# Patient Record
Sex: Male | Born: 1975
Health system: Southern US, Community
[De-identification: ages and names within clinical notes are randomized; demographics above are authoritative.]

## PROBLEM LIST (undated history)

## (undated) DIAGNOSIS — M179 Osteoarthritis of knee, unspecified: Secondary | ICD-10-CM

## (undated) DIAGNOSIS — R7303 Prediabetes: Secondary | ICD-10-CM

## (undated) DIAGNOSIS — Z87442 Personal history of urinary calculi: Secondary | ICD-10-CM

## (undated) DIAGNOSIS — I10 Essential (primary) hypertension: Secondary | ICD-10-CM

## (undated) DIAGNOSIS — Z8739 Personal history of other diseases of the musculoskeletal system and connective tissue: Secondary | ICD-10-CM

## (undated) DIAGNOSIS — M1990 Unspecified osteoarthritis, unspecified site: Secondary | ICD-10-CM

## (undated) DIAGNOSIS — M1710 Unilateral primary osteoarthritis, unspecified knee: Secondary | ICD-10-CM

## (undated) HISTORY — DX: Unspecified osteoarthritis, unspecified site: M19.90

## (undated) HISTORY — DX: Essential (primary) hypertension: I10

## (undated) HISTORY — PX: ARTHROSCOPY KNEE W/ DRILLING: SUR92

---

## 2000-05-01 ENCOUNTER — Encounter: Payer: Self-pay | Admitting: Orthopedic Surgery

## 2000-05-01 ENCOUNTER — Ambulatory Visit (HOSPITAL_COMMUNITY): Admission: RE | Admit: 2000-05-01 | Discharge: 2000-05-01 | Payer: Self-pay | Admitting: Orthopedic Surgery

## 2001-07-05 ENCOUNTER — Emergency Department (HOSPITAL_COMMUNITY): Admission: EM | Admit: 2001-07-05 | Discharge: 2001-07-05 | Payer: Self-pay | Admitting: Emergency Medicine

## 2001-07-16 ENCOUNTER — Emergency Department (HOSPITAL_COMMUNITY): Admission: EM | Admit: 2001-07-16 | Discharge: 2001-07-16 | Payer: Self-pay | Admitting: Emergency Medicine

## 2005-02-16 ENCOUNTER — Emergency Department (HOSPITAL_COMMUNITY): Admission: EM | Admit: 2005-02-16 | Discharge: 2005-02-16 | Payer: Self-pay | Admitting: Emergency Medicine

## 2005-02-21 ENCOUNTER — Encounter: Admission: RE | Admit: 2005-02-21 | Discharge: 2005-02-21 | Payer: Self-pay | Admitting: Sports Medicine

## 2006-06-12 ENCOUNTER — Encounter: Admission: RE | Admit: 2006-06-12 | Discharge: 2006-06-12 | Payer: Self-pay | Admitting: Sports Medicine

## 2011-02-06 ENCOUNTER — Emergency Department (HOSPITAL_COMMUNITY): Payer: BC Managed Care – PPO

## 2011-02-06 ENCOUNTER — Emergency Department (HOSPITAL_COMMUNITY)
Admission: EM | Admit: 2011-02-06 | Discharge: 2011-02-06 | Disposition: A | Discharge status: Home / Self Care | Payer: BC Managed Care – PPO | Attending: Emergency Medicine | Admitting: Emergency Medicine

## 2011-02-06 DIAGNOSIS — R10816 Epigastric abdominal tenderness: Secondary | ICD-10-CM | POA: Insufficient documentation

## 2011-02-06 DIAGNOSIS — R0602 Shortness of breath: Secondary | ICD-10-CM | POA: Insufficient documentation

## 2011-02-06 DIAGNOSIS — I10 Essential (primary) hypertension: Secondary | ICD-10-CM | POA: Insufficient documentation

## 2011-02-06 DIAGNOSIS — R209 Unspecified disturbances of skin sensation: Secondary | ICD-10-CM | POA: Insufficient documentation

## 2011-02-06 DIAGNOSIS — R110 Nausea: Secondary | ICD-10-CM | POA: Insufficient documentation

## 2011-02-06 DIAGNOSIS — R1013 Epigastric pain: Secondary | ICD-10-CM | POA: Insufficient documentation

## 2011-02-06 LAB — DIFFERENTIAL
Basophils Absolute: 0.1 10*3/uL (ref 0.0–0.1)
Basophils Relative: 1 % (ref 0–1)
Eosinophils Absolute: 0.1 10*3/uL (ref 0.0–0.7)
Eosinophils Relative: 1 % (ref 0–5)
Lymphocytes Relative: 32 % (ref 12–46)
Lymphs Abs: 2.8 10*3/uL (ref 0.7–4.0)
Monocytes Absolute: 0.8 10*3/uL (ref 0.1–1.0)
Monocytes Relative: 9 % (ref 3–12)
Neutro Abs: 5 10*3/uL (ref 1.7–7.7)
Neutrophils Relative %: 57 % (ref 43–77)

## 2011-02-06 LAB — POCT CARDIAC MARKERS
CKMB, poc: 1 ng/mL (ref 1.0–8.0)
CKMB, poc: <1 ng/mL — ABNORMAL LOW (ref 1.0–8.0)
Myoglobin, poc: 118 ng/mL (ref 12–200)
Myoglobin, poc: 111 ng/mL (ref 12–200)
Troponin i, poc: <0.05 ng/mL (ref 0.00–0.09)

## 2011-02-06 LAB — CBC
HCT: 41.2 % (ref 39.0–52.0)
MCHC: 33.7 g/dL (ref 30.0–36.0)
MCV: 79.5 fL (ref 78.0–100.0)
Platelets: 267 10*3/uL (ref 150–400)
RDW: 15.5 % (ref 11.5–15.5)

## 2011-02-06 LAB — CK TOTAL AND CKMB (NOT AT ARMC): CK, MB: 2.3 ng/mL (ref 0.3–4.0)

## 2012-12-08 ENCOUNTER — Ambulatory Visit (INDEPENDENT_AMBULATORY_CARE_PROVIDER_SITE_OTHER): Payer: BC Managed Care – PPO | Admitting: Physician Assistant

## 2012-12-08 VITALS — BP 142/83 | HR 113 | Temp 99.6°F | Resp 17 | Ht 69.0 in | Wt 360.0 lb

## 2012-12-08 DIAGNOSIS — R51 Headache: Secondary | ICD-10-CM

## 2012-12-08 DIAGNOSIS — R059 Cough, unspecified: Secondary | ICD-10-CM

## 2012-12-08 DIAGNOSIS — R509 Fever, unspecified: Secondary | ICD-10-CM

## 2012-12-08 DIAGNOSIS — R05 Cough: Secondary | ICD-10-CM

## 2012-12-08 LAB — POCT CBC
Granulocyte percent: 75.4 %G (ref 37–80)
HCT, POC: 42.3 % — AB (ref 43.5–53.7)
Hemoglobin: 13.3 g/dL — AB (ref 14.1–18.1)
Lymph, poc: 1.3 (ref 0.6–3.4)
MCH, POC: 26.5 pg — AB (ref 27–31.2)
MCHC: 13.4 g/dL — AB (ref 31.8–35.4)
MCV: 84.2 fL (ref 80–97)
MID (cbc): 0.8 (ref 0–0.9)
MPV: 9.3 fL (ref 0–99.8)
POC Granulocyte: 6.6 (ref 2–6.9)
POC LYMPH PERCENT: 14.9 %L (ref 10–50)
POC MID %: 9.7 %M (ref 0–12)
Platelet Count, POC: 253 10*3/uL (ref 142–424)
RBC: 5.02 M/uL (ref 4.69–6.13)
RDW, POC: 14.7 %
WBC: 8.7 10*3/uL (ref 4.6–10.2)

## 2012-12-08 MED ORDER — HYDROCOD POLST-CHLORPHEN POLST 10-8 MG/5ML PO LQCR: 5.0000 mL | Freq: Two times a day (BID) | ORAL | Status: DC | PRN

## 2012-12-08 MED ORDER — OSELTAMIVIR PHOSPHATE 75 MG PO CAPS: 75.0000 mg | ORAL_CAPSULE | Freq: Two times a day (BID) | ORAL | Status: DC

## 2012-12-08 NOTE — Progress Notes (Signed)
   Patient ID: [name] Gordon MRN: [id number], DOB: 11-13-1976, 36 y.o. Date of Encounter: 12/08/2012, 10:25 AM  Primary Physician: [name], MD  Chief Complaint: fever, chills, nasal congestion  HPI: 36 y.o. year old male with presents with 1 day history of nasal congestion, fever, chills, and cough productive of green sputum. Complains of chest pain while coughing as well as headache. No SOB, nausea, vomiting, or dizziness. Has tried Catering manager and Mucinex which have not helped much. Also complains of constipation - he has not had a bowel movement x 1 day which is abnormal for him. Admits that he does not drink a lot of water.    Patient is otherwise doing well without issues or complaints.  Past Medical History  Diagnosis Date  . Arthritis      Home Meds: Prior to Admission medications   Medication Sig Start Date End Date Taking? Authorizing Provider  nebivolol (BYSTOLIC) 10 MG tablet Take 10 mg by mouth daily.   Yes Historical Provider, MD    Allergies: No Known Allergies  History   Social History  . Marital Status: Married    Spouse Name: N/A    Number of Children: N/A  . Years of Education: N/A   Occupational History  . Not on file.   Social History Main Topics  . Smoking status: Never Smoker   . Smokeless tobacco: Not on file  . Alcohol Use: No  . Drug Use: No  . Sexually Active: Yes    Birth Control/ Protection: None   Other Topics Concern  . Not on file   Social History Narrative  . No narrative on file     Review of Systems: Constitutional: positive for chills and fever. Negative for night sweats, weight changes, or fatigue  HEENT: negative for vision changes, hearing loss. Positive for congestion, rhinorrhea, ST. No epistaxis, or sinus pressure Cardiovascular: negative for chest pain or palpitations Respiratory: positive for cough. negative for hemoptysis, wheezing, shortness of breath. Abdominal: negative for abdominal pain, nausea,  vomiting, diarrhea, or constipation Dermatological: negative for rashes. Neurologic: negative for headache, dizziness, or syncope   Physical Exam: Blood pressure 142/83, pulse 113, temperature 99.6 F (37.6 C), temperature source Oral, resp. rate 17, height 5\' 9"  (1.753 m), weight 360 lb (163.295 kg), SpO2 99.00%., Body mass index is 53.16 kg/(m^2). General: Well developed, well nourished, in no acute distress. Head: Normocephalic, atraumatic, eyes without discharge, sclera non-icteric, nares are without discharge. Bilateral auditory canals clear, TM's are without perforation, pearly grey and translucent with reflective cone of light bilaterally. Oral cavity moist, posterior pharynx without exudate, erythema, peritonsillar abscess, or post nasal drip.  Neck: Supple. No thyromegaly. Full ROM. No lymphadenopathy. Lungs: Clear bilaterally to auscultation without wheezes, rales, or rhonchi. Breathing is unlabored. Heart: RRR with S1 S2. No murmurs, rubs, or gallops appreciated. Msk:  Strength and tone normal for age. Extremities/Skin: Warm and dry. No clubbing or cyanosis. No edema.  Neuro: Alert and oriented X 3. Moves all extremities spontaneously. Gait is normal. CNII-XII grossly in tact. Psych:  Responds to questions appropriately with a normal affect.   Labs:  Ibuprofen 800 mg given today in office.  ASSESSMENT AND PLAN:  36 y.o. year old male with influenza Increase fluids and rest Ibuprofen and tylenol in alternating doses q3hours Start Tamiflu 75 mg bid x 5 days Tussionex q12hours prn cough Follow up if symptoms worsen or fail to improve.  Grier Mitts, PA-C 12/08/2012 10:25 AM

## 2013-03-27 ENCOUNTER — Ambulatory Visit (INDEPENDENT_AMBULATORY_CARE_PROVIDER_SITE_OTHER): Payer: BC Managed Care – PPO | Admitting: Family Medicine

## 2013-03-27 VITALS — BP 159/106 | HR 88 | Temp 98.4°F | Resp 18 | Ht 69.0 in | Wt 362.0 lb

## 2013-03-27 DIAGNOSIS — J329 Chronic sinusitis, unspecified: Secondary | ICD-10-CM

## 2013-03-27 DIAGNOSIS — J069 Acute upper respiratory infection, unspecified: Secondary | ICD-10-CM

## 2013-03-27 DIAGNOSIS — I10 Essential (primary) hypertension: Secondary | ICD-10-CM

## 2013-03-27 DIAGNOSIS — J209 Acute bronchitis, unspecified: Secondary | ICD-10-CM

## 2013-03-27 MED ORDER — FLUTICASONE PROPIONATE 50 MCG/ACT NA SUSP: 2.0000 | Freq: Every day | NASAL | Status: DC

## 2013-03-27 MED ORDER — HYDROCODONE-HOMATROPINE 5-1.5 MG/5ML PO SYRP: 5.0000 mL | ORAL_SOLUTION | ORAL | Status: DC | PRN

## 2013-03-27 MED ORDER — AMOXICILLIN 875 MG PO TABS: 875.0000 mg | ORAL_TABLET | Freq: Two times a day (BID) | ORAL | Status: DC

## 2013-03-27 NOTE — Progress Notes (Signed)
Subjective: 37 year old man who is here for a respiratory tract infection that he's had for about a week and a half. He has now developed a lot of drainage from his head, coughing, and some generalized malaise. He did feel a little feverish several times, though he does not have any temperature record. 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 His wife is very feels like he got the original infection from.  Objective: Obese male in no major acute distress. His TMs are normal. Nose congested. Throat mildly erythematous. Neck supple without major nodes. Chest is clear to auscultation. Lungs slight rhonchus in the right upper lobe posteriorly. Heart regular without murmurs.  Assessment: URI Sinusitis Bronchitis Hypertension, his PCP is working on this  Plan: Amoxicillin 875 twice a day Fluticasone nose spray Hycodan  Return if worse

## 2013-03-27 NOTE — Patient Instructions (Signed)
Take medications as directed  If getting worse return  Drink plenty of fluids and get enough rest  Make sure your blood pressures monitored

## 2015-12-10 DIAGNOSIS — K760 Fatty (change of) liver, not elsewhere classified: Secondary | ICD-10-CM

## 2015-12-10 HISTORY — DX: Fatty (change of) liver, not elsewhere classified: K76.0

## 2016-02-23 ENCOUNTER — Encounter (HOSPITAL_COMMUNITY): Payer: Self-pay

## 2016-02-23 ENCOUNTER — Ambulatory Visit (INDEPENDENT_AMBULATORY_CARE_PROVIDER_SITE_OTHER): Payer: PRIVATE HEALTH INSURANCE

## 2016-02-23 ENCOUNTER — Ambulatory Visit (INDEPENDENT_AMBULATORY_CARE_PROVIDER_SITE_OTHER): Payer: PRIVATE HEALTH INSURANCE | Admitting: Physician Assistant

## 2016-02-23 ENCOUNTER — Telehealth: Payer: Self-pay

## 2016-02-23 ENCOUNTER — Ambulatory Visit (HOSPITAL_COMMUNITY)
Admission: RE | Admit: 2016-02-23 | Discharge: 2016-02-23 | Disposition: A | Discharge status: Home / Self Care | Payer: PRIVATE HEALTH INSURANCE | Source: Ambulatory Visit | Attending: Physician Assistant | Admitting: Physician Assistant

## 2016-02-23 VITALS — BP 124/82 | HR 82 | Temp 98.7°F | Resp 16 | Ht 69.0 in | Wt 387.0 lb

## 2016-02-23 DIAGNOSIS — D72829 Elevated white blood cell count, unspecified: Secondary | ICD-10-CM

## 2016-02-23 DIAGNOSIS — R109 Unspecified abdominal pain: Secondary | ICD-10-CM

## 2016-02-23 DIAGNOSIS — K760 Fatty (change of) liver, not elsewhere classified: Secondary | ICD-10-CM | POA: Insufficient documentation

## 2016-02-23 LAB — POCT URINALYSIS DIP (MANUAL ENTRY)
BILIRUBIN UA: NEGATIVE
GLUCOSE UA: NEGATIVE
Ketones, POC UA: NEGATIVE
Leukocytes, UA: NEGATIVE
Nitrite, UA: NEGATIVE
Protein Ur, POC: NEGATIVE
RBC UA: NEGATIVE
SPEC GRAV UA: 1.02
Urobilinogen, UA: 0.2
pH, UA: 5.5

## 2016-02-23 LAB — POCT CBC
GRANULOCYTE PERCENT: 69.5 % (ref 37–80)
HEMATOCRIT: 37.6 % — AB (ref 43.5–53.7)
Hemoglobin: 13.9 g/dL — AB (ref 14.1–18.1)
LYMPH, POC: 2.7 (ref 0.6–3.4)
MCH: 28.2 pg (ref 27–31.2)
MCHC: 36.9 g/dL — AB (ref 31.8–35.4)
MCV: 76.6 fL — AB (ref 80–97)
MID (cbc): 0.6 (ref 0–0.9)
MPV: 7.7 fL (ref 0–99.8)
POC GRANULOCYTE: 7.5 — AB (ref 2–6.9)
POC LYMPH PERCENT: 25.1 %L (ref 10–50)
POC MID %: 5.4 %M (ref 0–12)
Platelet Count, POC: 259 10*3/uL (ref 142–424)
RBC: 4.91 M/uL (ref 4.69–6.13)
RDW, POC: 14.3 %
WBC: 10.8 10*3/uL — AB (ref 4.6–10.2)

## 2016-02-23 LAB — COMPLETE METABOLIC PANEL WITH GFR
ALT: 45 U/L (ref 9–46)
AST: 27 U/L (ref 10–40)
Albumin: 4 g/dL (ref 3.6–5.1)
Alkaline Phosphatase: 69 U/L (ref 40–115)
BILIRUBIN TOTAL: 0.7 mg/dL (ref 0.2–1.2)
BUN: 10 mg/dL (ref 7–25)
CO2: 23 mmol/L (ref 20–31)
CREATININE: 0.95 mg/dL (ref 0.60–1.35)
Calcium: 9 mg/dL (ref 8.6–10.3)
Chloride: 105 mmol/L (ref 98–110)
GFR, Est African American: >89 mL/min (ref >=60)
GFR, Est Non African American: >89 mL/min (ref >=60)
GLUCOSE: 96 mg/dL (ref 65–99)
Potassium: 3.9 mmol/L (ref 3.5–5.3)
SODIUM: 140 mmol/L (ref 135–146)
TOTAL PROTEIN: 7 g/dL (ref 6.1–8.1)

## 2016-02-23 LAB — POCT GLYCOSYLATED HEMOGLOBIN (HGB A1C): HEMOGLOBIN A1C: 5.7

## 2016-02-23 LAB — GLUCOSE, POCT (MANUAL RESULT ENTRY): POC Glucose: 109 mg/dl — AB (ref 70–99)

## 2016-02-23 MED ORDER — IOHEXOL 300 MG/ML  SOLN
50.0000 mL | Freq: Once | INTRAMUSCULAR | Status: AC | PRN
  Administered 2016-02-23: 50 mL via INTRAVENOUS

## 2016-02-23 MED ORDER — MELOXICAM 15 MG PO TABS: 15.0000 mg | ORAL_TABLET | Freq: Every day | ORAL | Status: DC

## 2016-02-23 MED ORDER — CYCLOBENZAPRINE HCL 10 MG PO TABS: 10.0000 mg | ORAL_TABLET | Freq: Three times a day (TID) | ORAL | Status: DC | PRN

## 2016-02-23 NOTE — Addendum Note (Signed)
Addended by: Tereasa Coop on: 02/23/2016 02:32 PM   Modules accepted: Orders

## 2016-02-23 NOTE — Progress Notes (Signed)
02/23/2016 10:31 AM   DOB: Aug 08, 1976 / MRN: [id number]  SUBJECTIVE:  [name] Gordon is a 40 y.o. male presenting for right flank pain that started three weeks ago. Describes the pain as constant and aching in nature. Associates some nausea.  Does not feel it is getting worse or better.  Has tried aleve and heating pad without relief.     He has No Known Allergies.   He  has a past medical history of Arthritis and Hypertension.    He  reports that he has never smoked. He does not have any smokeless tobacco history on file. He reports that he does not drink alcohol or use illicit drugs. He  reports that he currently engages in sexual activity. He reports using the following method of birth control/protection: None. The patient  has past surgical history that includes Arthroscopy knee w/ drilling.  His family history includes Cancer in his father; Diabetes in his mother; Hypertension in his mother.  Review of Systems  Constitutional: Negative for fever.  Respiratory: Negative for cough.   Gastrointestinal: Positive for nausea. Negative for heartburn, vomiting, diarrhea, constipation, blood in stool and melena.  Skin: Negative for rash.  Neurological: Negative for dizziness and headaches.    Problem list and medications reviewed and updated by myself where necessary, and exist elsewhere in the encounter.   OBJECTIVE:  BP 124/82 mmHg  Pulse 82  Temp(Src) 98.7 F (37.1 C) (Oral)  Resp 16  Ht 5\' 9"  (1.753 m)  Wt 387 lb (175.542 kg)  BMI 57.12 kg/m2  SpO2 98%  Physical Exam  Constitutional: He is oriented to person, place, and time.  Cardiovascular: Normal rate.   Pulmonary/Chest: Effort normal and breath sounds normal.  Abdominal: Soft. Bowel sounds are normal. He exhibits no distension and no mass. There is no hepatosplenomegaly. There is tenderness. There is CVA tenderness (right side, not exquisite). There is no rigidity, no rebound, no guarding, no tenderness at McBurney's  point and negative Murphy's sign. No hernia.  Neurological: He is alert and oriented to person, place, and time.  Vitals reviewed.   Results for orders placed or performed in visit on 02/23/16 (from the past 72 hour(s))  POCT urinalysis dipstick     Status: None   Collection Time: 02/23/16  9:19 AM  Result Value Ref Range   Color, UA yellow yellow   Clarity, UA clear clear   Glucose, UA negative negative   Bilirubin, UA negative negative   Ketones, POC UA negative negative   Spec Grav, UA 1.020    Blood, UA negative negative   pH, UA 5.5    Protein Ur, POC negative negative   Urobilinogen, UA 0.2    Nitrite, UA Negative Negative   Leukocytes, UA Negative Negative  POCT CBC     Status: Abnormal   Collection Time: 02/23/16  9:37 AM  Result Value Ref Range   WBC 10.8 (A) 4.6 - 10.2 K/uL   Lymph, poc 2.7 0.6 - 3.4   POC LYMPH PERCENT 25.1 10 - 50 %L   MID (cbc) 0.6 0 - 0.9   POC MID % 5.4 0 - 12 %M   POC Granulocyte 7.5 (A) 2 - 6.9   Granulocyte percent 69.5 37 - 80 %G   RBC 4.91 4.69 - 6.13 M/uL   Hemoglobin 13.9 (A) 14.1 - 18.1 g/dL   HCT, POC 37.6 (A) 43.5 - 53.7 %   MCV 76.6 (A) 80 - 97 fL   MCH, POC 28.2  27 - 31.2 pg   MCHC 36.9 (A) 31.8 - 35.4 g/dL   RDW, POC 14.3 %   Platelet Count, POC 259 142 - 424 K/uL   MPV 7.7 0 - 99.8 fL  POCT glucose (manual entry)     Status: Abnormal   Collection Time: 02/23/16  9:40 AM  Result Value Ref Range   POC Glucose 109 (A) 70 - 99 mg/dl  POCT glycosylated hemoglobin (Hb A1C)     Status: None   Collection Time: 02/23/16  9:44 AM  Result Value Ref Range   Hemoglobin A1C 5.7     Dg Abd 1 View  02/23/2016  CLINICAL DATA:  Abdominal/right flank pain of unknown origin today. Initial encounter. EXAM: ABDOMEN - 1 VIEW COMPARISON:  None. FINDINGS: The bowel gas pattern is normal. No radio-opaque calculi or other significant radiographic abnormality are seen. IMPRESSION: Negative exam. Electronically Signed   By: Inge Rise M.D.    On: 02/23/2016 09:45    ASSESSMENT AND PLAN  [name] Gordon was seen today for right side side/flank pain.  Diagnoses and all orders for this visit:  Flank pain -     POCT urinalysis dipstick -     POCT CBC -     DG Abd 1 View; Future -     COMPLETE METABOLIC PANEL WITH GFR -     CT Abdomen Pelvis W Wo Contrast; Future  Morbid obesity due to excess calories (HCC) -     POCT glucose (manual entry) -     POCT glycosylated hemoglobin (Hb A1C)  Leukocytosis: I do not know why he is having pain.  Urine points away from stone.  CBC shows a possible infectious process.  Will eval further with advanced imaging.  If negative will treat symptomatically.  -     CT Abdomen Pelvis W Wo Contrast; Future    The patient was advised to call or return to clinic if he does not see an improvement in symptoms or to seek the care of the closest emergency department if he worsens with the above plan.   Philis Fendt, MHS, PA-C [facility] 02/23/2016 10:31 AM

## 2016-02-23 NOTE — Patient Instructions (Addendum)
You are to go over to [facility] main entrance now.  [place]    IF you received an x-ray today, you will receive an invoice from [facility]. Please contact [facility] at [phone] with questions or concerns regarding your invoice.   IF you received labwork today, you will receive an invoice from [employer]. Please contact [facility] at [phone] with questions or concerns regarding your invoice.   Our billing staff will not be able to assist you with questions regarding bills from these companies.  You will be contacted with the lab results as soon as they are available. The fastest way to get your results is to activate your My Chart account. Instructions are located on the last page of this paperwork. If you have not heard from [place] regarding the results in 2 weeks, please contact this office.

## 2016-03-21 ENCOUNTER — Other Ambulatory Visit: Payer: Self-pay | Admitting: Physician Assistant

## 2016-05-24 ENCOUNTER — Ambulatory Visit (INDEPENDENT_AMBULATORY_CARE_PROVIDER_SITE_OTHER): Payer: PRIVATE HEALTH INSURANCE | Admitting: Family Medicine

## 2016-05-24 VITALS — BP 112/62 | HR 71 | Temp 98.2°F | Resp 17 | Ht 69.5 in | Wt 376.0 lb

## 2016-05-24 DIAGNOSIS — M1000 Idiopathic gout, unspecified site: Secondary | ICD-10-CM | POA: Diagnosis not present

## 2016-05-24 DIAGNOSIS — M109 Gout, unspecified: Secondary | ICD-10-CM

## 2016-05-24 MED ORDER — INDOMETHACIN 50 MG PO CAPS: 50.0000 mg | ORAL_CAPSULE | Freq: Three times a day (TID) | ORAL | Status: DC

## 2016-05-24 MED ORDER — COLCHICINE 0.6 MG PO TABS: ORAL_TABLET | ORAL | Status: DC

## 2016-05-24 NOTE — Progress Notes (Signed)
Subjective:  By signing my name below, I, Raven Small, attest that this documentation has been prepared under the direction and in the presence of Delman Cheadle , MD.  Electronically Signed: Thea Alken, ED Scribe. 05/24/2016. 10:40 AM.   Patient ID: [name] Gordon, male    DOB: 06-Jan-1976, 40 y.o.   MRN: [id number]  HPI   Chief Complaint  Patient presents with  . Foot Pain    gout right     HPI Comments: [name] Gordon is a 40 y.o. male who presents to the [facility] complaining of gradually worsening right foot pain that began 2 days ago. Pt states initially he thought pain was caused but wearing old shoes, so bought new shoes without relief to pain. He reports right foot pain worsened both yesterday and today with swelling and warmth, causing him to miss work today. He believes this may be gout and reports hx of gout at age 56. 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   There are no active problems to display for this patient.  Past Medical History  Diagnosis Date  . Arthritis   . Hypertension    Past Surgical History  Procedure Laterality Date  . Arthroscopy knee w/ drilling     No Known Allergies Prior to Admission medications   Medication Sig Start Date End Date Taking? Authorizing Provider  nebivolol (BYSTOLIC) 10 MG tablet Take 10 mg by mouth daily.   Yes Historical Provider, MD  olmesartan-hydrochlorothiazide (BENICAR HCT) 20-12.5 MG per tablet Take 1 tablet by mouth daily. Reported on 02/23/2016   Yes Historical Provider, MD  cyclobenzaprine (FLEXERIL) 10 MG tablet Take 1 tablet (10 mg total) by mouth 3 (three) times daily as needed for muscle spasms. Patient not taking: Reported on 05/24/2016 02/23/16   Tereasa Coop, PA-C  meloxicam (MOBIC) 15 MG tablet Take 1 tablet (15 mg total) by mouth daily. Patient not taking: Reported on 05/24/2016 02/23/16   Tereasa Coop, PA-C    Review of Systems  Constitutional:  Positive for activity change. Negative for fever, appetite change and unexpected weight change (gain).  Cardiovascular: Negative for leg swelling.  Musculoskeletal: Positive for arthralgias. Negative for gait problem.  Skin: Positive for color change. Negative for pallor and wound.  Allergic/Immunologic: Negative for immunocompromised state.  Neurological: Negative for weakness and numbness.  Hematological: Negative for adenopathy. Does not bruise/bleed easily.  Psychiatric/Behavioral: Positive for sleep disturbance.    Objective:   Physical Exam  Constitutional: He is oriented to person, place, and time. He appears well-developed and well-nourished. No distress.  HENT:  Head: Normocephalic and atraumatic.  Eyes: Conjunctivae and EOM are normal.  Neck: Neck supple.  Cardiovascular: Normal rate.   Pulses:      Dorsalis pedis pulses are 2+ on the right side.       Posterior tibial pulses are 2+ on the right side.  Pulmonary/Chest: Effort normal.  Musculoskeletal: Normal range of motion.  No pain with flexion but pain with extension to right MTP. Mild erythema and mild warmth. Trace LE pitting edema.   Neurological: He is alert and oriented to person, place, and time.  Skin: Skin is warm and dry.  Psychiatric: He has a normal mood and affect. His behavior is normal.  Nursing note and vitals reviewed.  Filed Vitals:   05/24/16 1029  BP: 112/62  Pulse: 71  Temp: 98.2 F (36.8 C)  TempSrc: Oral  Resp: 17  Height: 5' 9.5" (1.765 m)  Weight: 376 lb (170.552 kg)  SpO2: 95%    Assessment & Plan:   1. Podagra   Clinically suspect gout - will try treatment with below, increase water, alter diet - pt is able to identify many areas for improvement in this. RICE. If sxs do not improve in the next 3d, then RTC for labs and xray.  Meds ordered this encounter  Medications  . colchicine (COLCRYS) 0.6 MG tablet    Sig: Take 2 tabs immed upon onset of gout flair and 1 tab an hour later     Dispense:  30 tablet    Refill:  0  . indomethacin (INDOCIN) 50 MG capsule    Sig: Take 1 capsule (50 mg total) by mouth 3 (three) times daily with meals.    Dispense:  60 capsule    Refill:  0   I personally performed the services described in this documentation, which was scribed in my presence. The recorded information has been reviewed and considered, and addended by me as needed.   Delman Cheadle, M.D.  [facility] [address] [place], [place] [address] [phone] phone [phone] fax  05/24/2016 6:40 PM

## 2016-05-24 NOTE — Patient Instructions (Addendum)
You can reduce your uric acid level by avoiding red meat, saturated fats (dairy fats, butter fats, animal fats), high fructose corn syrup, and beer and drinking more water.    IF you received an x-ray today, you will receive an invoice from [facility]. Please contact [facility] at [phone] with questions or concerns regarding your invoice.   IF you received labwork today, you will receive an invoice from [employer]. Please contact [facility] at [phone] with questions or concerns regarding your invoice.   Our billing staff will not be able to assist you with questions regarding bills from these companies.  You will be contacted with the lab results as soon as they are available. The fastest way to get your results is to activate your My Chart account. Instructions are located on the last page of this paperwork. If you have not heard from [place] regarding the results in 2 weeks, please contact this office.     There are some easy diet adjustments that you can make to lower your blood uric acid level and thereby hopefully never have to suffer from another gout flair (or at least less frequently).  You should avoid alcohol, drink plenty of water, and try to follow a "low purine" diet as your body produces uric acid when it breaks down prurines-substances that are found naturally in your body, as well as in certain foods such as organ meats, anchioves, herring, asparagus, and mushrooms. Also, increasing your diet in certain foods that may lower uric acid levels is a pretty safe way to decrease your likelihood of gout so consider drinking coffee (regular and/or decaf), eating fruits with Vitamin C in them such as citrus fruits, strawberries, broccoli,  brussel sprouts, papaya, and cantaloupe (though megadoses of vitamin C supplements may do the opposite and increase your body's uric acid levels), and/or eating more cherries and other dark-colored fruits,  such as blackberries, blueberries, purple grapes and raspberries. In addition, getting plenty of vitamin A though yellow fruits, or dark green/yellow vegetables at least every other day is good.  Other general diet guidelines for people with gout who need to lower their blood uric acid levels are as follows:  Drink 8 to 16 cups ( about 2 to 4 liters) of fluid each day, with at least half being water. Eat a moderate amount of protein, preferably from healthy sources, such as low-fat or fat-free dairy, tofu, eggs, and nut butters. Limit your daily intake of meat, fish, and poultry to 4 to 6 ounces. Avoid high fat meats and desserts. Decrease your intake of shellfish, beef, lamb, pork, eggs and cheese. Avoid drastic weight reduction or fasting.  If weight loss is desired lose it over a period of several months.  Gout Gout is an inflammatory arthritis caused by a buildup of uric acid crystals in the joints. Uric acid is a chemical that is normally present in the blood. When the level of uric acid in the blood is too high it can form crystals that deposit in your joints and tissues. This causes joint redness, soreness, and swelling (inflammation). Repeat attacks are common. Over time, uric acid crystals can form into masses (tophi) near a joint, destroying bone and causing disfigurement. Gout is treatable and often preventable. CAUSES  The disease begins with elevated levels of uric acid in the blood. Uric acid is produced by your body when it breaks down a naturally found substance called purines. Certain foods you eat, such as meats and fish, contain  high amounts of purines. Causes of an elevated uric acid level include:  Being passed down from parent to child (heredity).  Diseases that cause increased uric acid production (such as obesity, psoriasis, and certain cancers).  Excessive alcohol use.  Diet, especially diets rich in meat and seafood.  Medicines, including certain cancer-fighting  medicines (chemotherapy), water pills (diuretics), and aspirin.  Chronic kidney disease. The kidneys are no longer able to remove uric acid well.  Problems with metabolism. Conditions strongly associated with gout include:  Obesity.  High blood pressure.  High cholesterol.  Diabetes. Not everyone with elevated uric acid levels gets gout. It is not understood why some people get gout and others do not. Surgery, joint injury, and eating too much of certain foods are some of the factors that can lead to gout attacks. SYMPTOMS   An attack of gout comes on quickly. It causes intense pain with redness, swelling, and warmth in a joint.  Fever can occur.  Often, only one joint is involved. Certain joints are more commonly involved:  Base of the big toe.  Knee.  Ankle.  Wrist.  Finger. Without treatment, an attack usually goes away in a few days to weeks. Between attacks, you usually will not have symptoms, which is different from many other forms of arthritis. DIAGNOSIS  Your caregiver will suspect gout based on your symptoms and exam. In some cases, tests may be recommended. The tests may include:  Blood tests.  Urine tests.  X-rays.  Joint fluid exam. This exam requires a needle to remove fluid from the joint (arthrocentesis). Using a microscope, gout is confirmed when uric acid crystals are seen in the joint fluid. TREATMENT  There are two phases to gout treatment: treating the sudden onset (acute) attack and preventing attacks (prophylaxis).  Treatment of an Acute Attack.  Medicines are used. These include anti-inflammatory medicines or steroid medicines.  An injection of steroid medicine into the affected joint is sometimes necessary.  The painful joint is rested. Movement can worsen the arthritis.  You may use warm or cold treatments on painful joints, depending which works best for you.  Treatment to Prevent Attacks.  If you suffer from frequent gout attacks,  your caregiver may advise preventive medicine. These medicines are started after the acute attack subsides. These medicines either help your kidneys eliminate uric acid from your body or decrease your uric acid production. You may need to stay on these medicines for a very long time.  The early phase of treatment with preventive medicine can be associated with an increase in acute gout attacks. For this reason, during the first few months of treatment, your caregiver may also advise you to take medicines usually used for acute gout treatment. Be sure you understand your caregiver's directions. Your caregiver may make several adjustments to your medicine dose before these medicines are effective.  Discuss dietary treatment with your caregiver or dietitian. Alcohol and drinks high in sugar and fructose and foods such as meat, poultry, and seafood can increase uric acid levels. Your caregiver or dietitian can advise you on drinks and foods that should be limited. HOME CARE INSTRUCTIONS   Do not take aspirin to relieve pain. This raises uric acid levels.  Only take over-the-counter or prescription medicines for pain, discomfort, or fever as directed by your caregiver.  Rest the joint as much as possible. When in bed, keep sheets and blankets off painful areas.  Keep the affected joint raised (elevated).  Apply warm or cold  treatments to painful joints. Use of warm or cold treatments depends on which works best for you.  Use crutches if the painful joint is in your leg.  Drink enough fluids to keep your urine clear or pale yellow. This helps your body get rid of uric acid. Limit alcohol, sugary drinks, and fructose drinks.  Follow your dietary instructions. Pay careful attention to the amount of protein you eat. Your daily diet should emphasize fruits, vegetables, whole grains, and fat-free or low-fat milk products. Discuss the use of coffee, vitamin C, and cherries with your caregiver or dietitian.  These may be helpful in lowering uric acid levels.  Maintain a healthy body weight. SEEK MEDICAL CARE IF:   You develop diarrhea, vomiting, or any side effects from medicines.  You do not feel better in 24 hours, or you are getting worse. SEEK IMMEDIATE MEDICAL CARE IF:   Your joint becomes suddenly more tender, and you have chills or a fever. MAKE SURE YOU:   Understand these instructions.  Will watch your condition.  Will get help right away if you are not doing well or get worse.   This information is not intended to replace advice given to you by your health care provider. Make sure you discuss any questions you have with your health care provider.   Document Released: 11/22/2000 Document Revised: 12/16/2014 Document Reviewed: 07/08/2012 Elsevier Interactive Patient Education [employer].

## 2016-07-01 ENCOUNTER — Other Ambulatory Visit: Payer: Self-pay | Admitting: Family Medicine

## 2016-12-09 DIAGNOSIS — M4800 Spinal stenosis, site unspecified: Secondary | ICD-10-CM

## 2016-12-09 HISTORY — DX: Spinal stenosis, site unspecified: M48.00

## 2017-06-04 ENCOUNTER — Other Ambulatory Visit: Payer: PRIVATE HEALTH INSURANCE

## 2017-06-04 ENCOUNTER — Other Ambulatory Visit: Payer: Self-pay | Admitting: Internal Medicine

## 2017-06-04 DIAGNOSIS — R109 Unspecified abdominal pain: Secondary | ICD-10-CM

## 2017-06-12 ENCOUNTER — Ambulatory Visit
Admission: RE | Admit: 2017-06-12 | Discharge: 2017-06-12 | Disposition: A | Discharge status: Home / Self Care | Payer: PRIVATE HEALTH INSURANCE | Source: Ambulatory Visit | Attending: Internal Medicine | Admitting: Internal Medicine

## 2017-06-12 DIAGNOSIS — R109 Unspecified abdominal pain: Secondary | ICD-10-CM

## 2017-06-25 ENCOUNTER — Other Ambulatory Visit: Payer: Self-pay | Admitting: Internal Medicine

## 2017-06-25 DIAGNOSIS — R109 Unspecified abdominal pain: Secondary | ICD-10-CM

## 2017-07-01 ENCOUNTER — Ambulatory Visit
Admission: RE | Admit: 2017-07-01 | Discharge: 2017-07-01 | Disposition: A | Discharge status: Home / Self Care | Payer: PRIVATE HEALTH INSURANCE | Source: Ambulatory Visit | Attending: Internal Medicine | Admitting: Internal Medicine

## 2017-07-01 DIAGNOSIS — R109 Unspecified abdominal pain: Secondary | ICD-10-CM

## 2017-07-01 MED ORDER — IOPAMIDOL (ISOVUE-300) INJECTION 61%
125.0000 mL | Freq: Once | INTRAVENOUS | Status: AC | PRN
  Administered 2017-07-01: 125 mL via INTRAVENOUS

## 2017-07-03 ENCOUNTER — Other Ambulatory Visit: Payer: Self-pay | Admitting: Internal Medicine

## 2017-07-03 DIAGNOSIS — M545 Low back pain: Secondary | ICD-10-CM

## 2017-07-07 ENCOUNTER — Other Ambulatory Visit: Payer: PRIVATE HEALTH INSURANCE

## 2017-07-18 ENCOUNTER — Ambulatory Visit
Admission: RE | Admit: 2017-07-18 | Discharge: 2017-07-18 | Disposition: A | Discharge status: Home / Self Care | Payer: PRIVATE HEALTH INSURANCE | Source: Ambulatory Visit | Attending: Internal Medicine | Admitting: Internal Medicine

## 2017-07-18 DIAGNOSIS — M545 Low back pain: Secondary | ICD-10-CM

## 2017-11-13 IMAGING — MR MR THORACIC SPINE W/O CM
4 of 6 series · 22 of 48 positions shown · non-contrast
Comparison: Two-view chest X ray 02/06/2011

CLINICAL DATA: Low back pain extending into the right abdomen.
Symptoms for 1 month.

EXAM:
MRI THORACIC SPINE WITHOUT CONTRAST
TECHNIQUE: Multiplanar, multisequence MR imaging of the thoracic spine was
performed. No intravenous contrast was administered.

[Series 22: T2 · sagittal · 3.0mm · 0.74mm/px · 7 of 19 slices shown (1 of 2)]
[im 1/19]
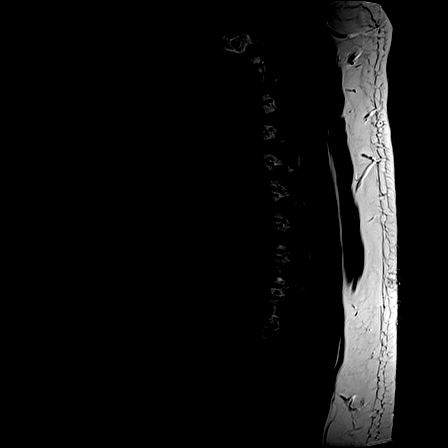
[im 4/19]
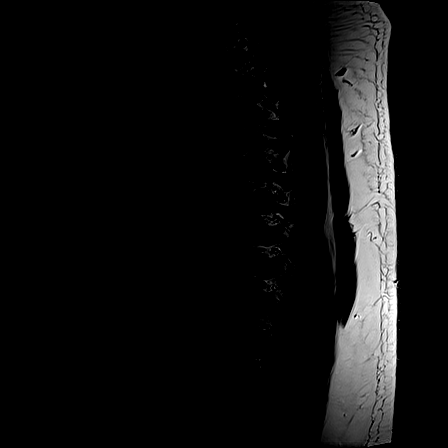
[im 7/19]
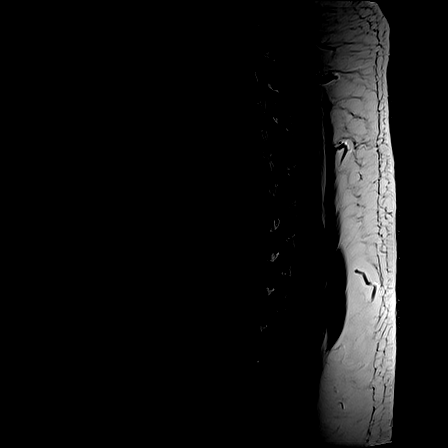
[im 10/19]
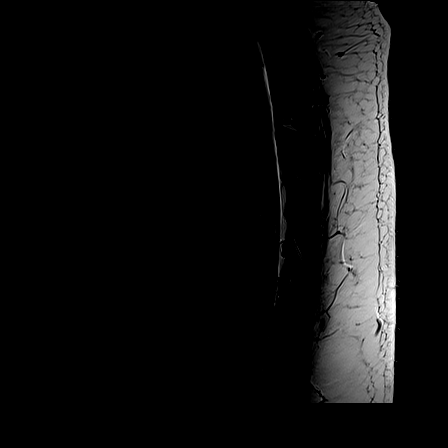
[im 13/19]
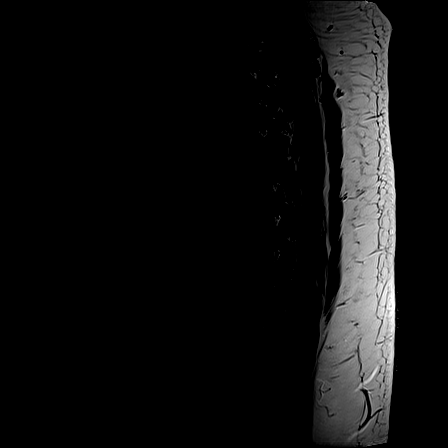
[im 16/19]
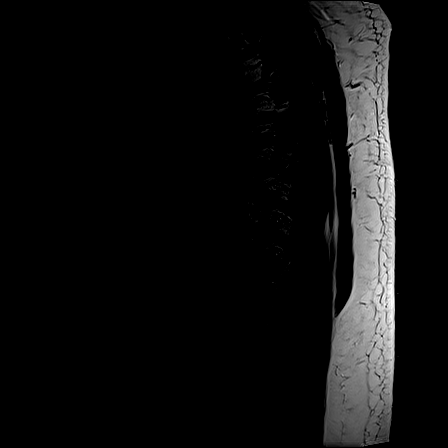
[im 19/19]
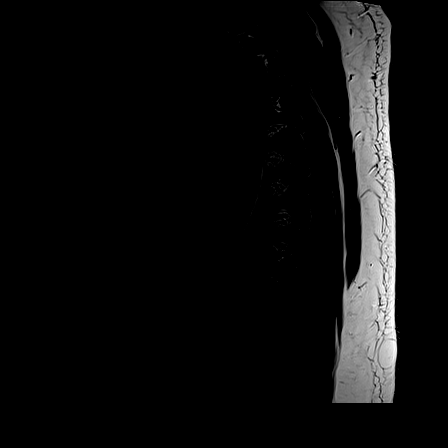

[Series 23: T1 · sagittal · 3.0mm · 0.74mm/px · 3 of 19 slices shown]
[im 4/19]
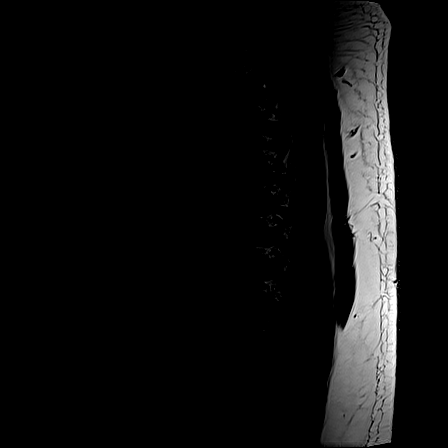
[im 11/19]
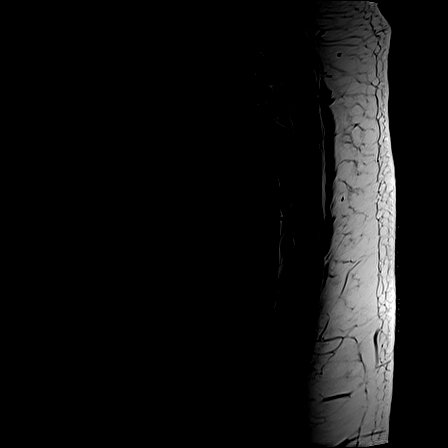
[im 19/19]
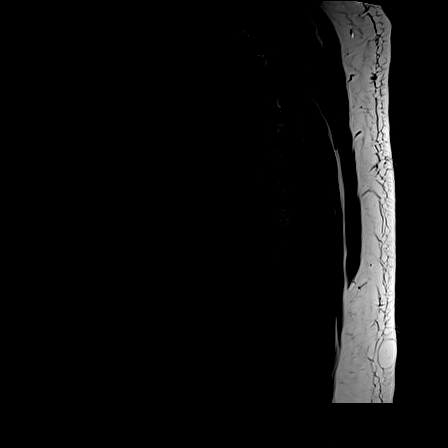

[Series 24: STIR · sagittal · 3.0mm · 0.74mm/px · 3 of 19 slices shown]
[im 4/19]
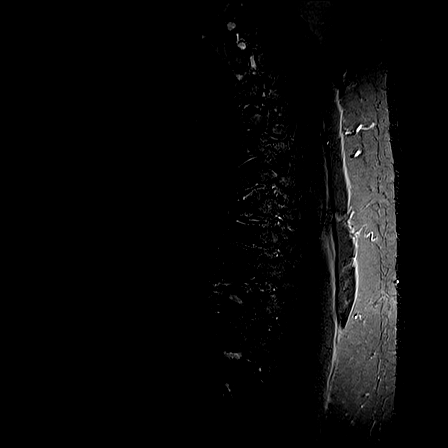
[im 11/19]
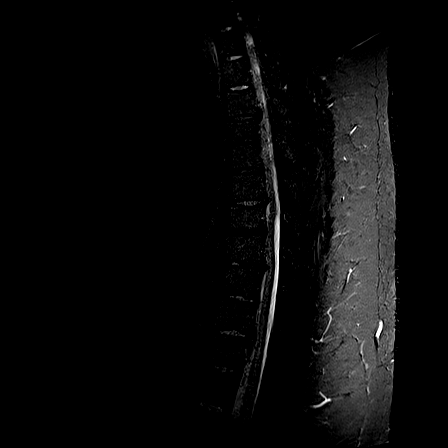
[im 19/19]
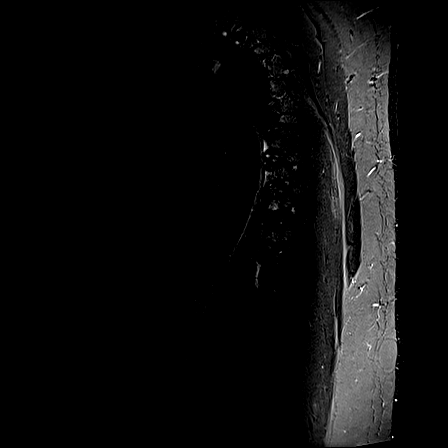

[Series 25: T2 · axial · 4.0mm · 0.35mm/px · z∈[-347,-107]mm · 9 of 37 slices shown (2 of 2)]
[im 1/37]
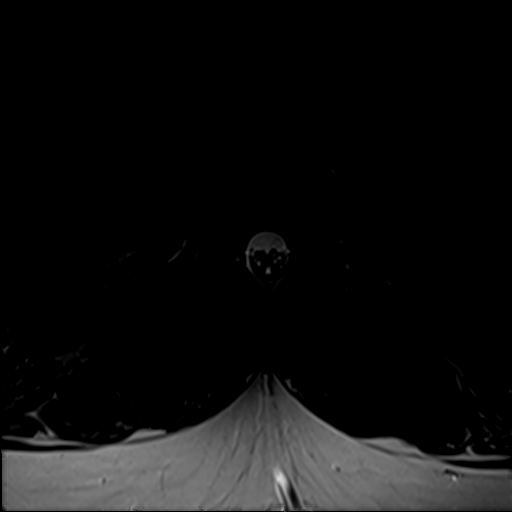
[im 7/37]
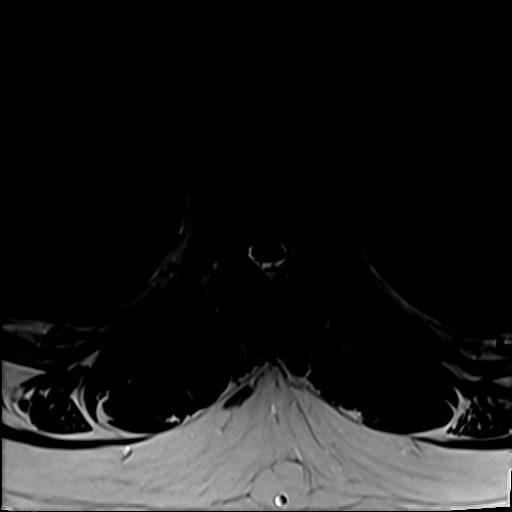
[im 10/37]
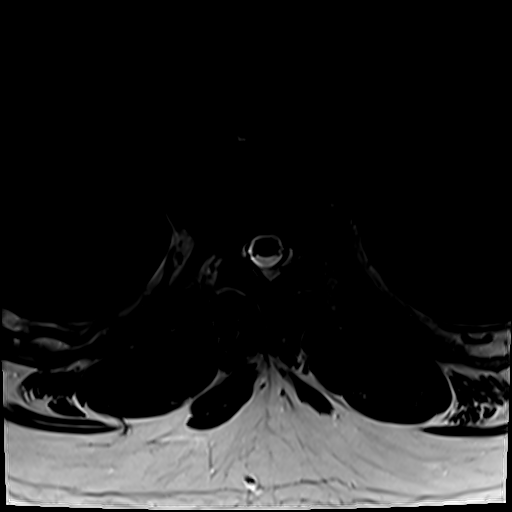
[im 17/37]
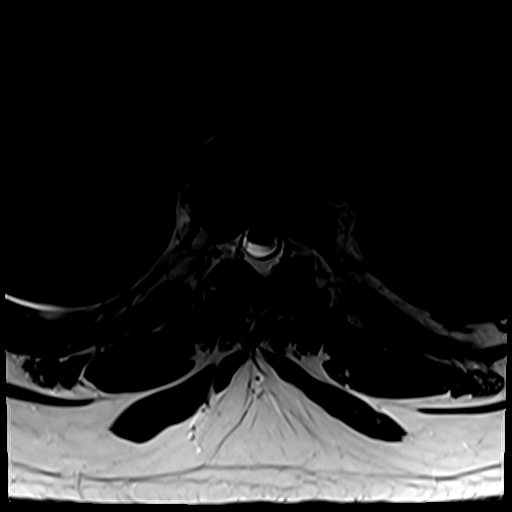
[im 20/37]
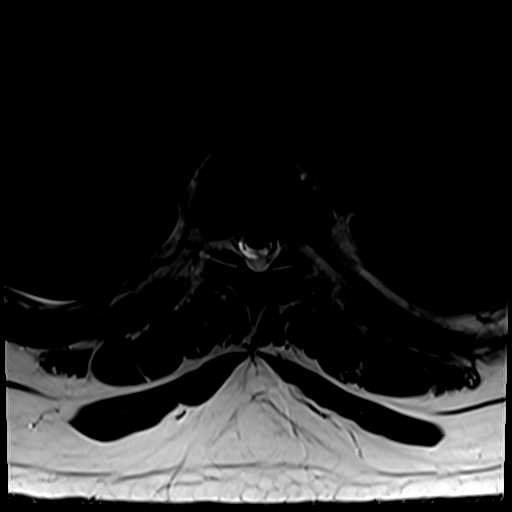
[im 27/37]
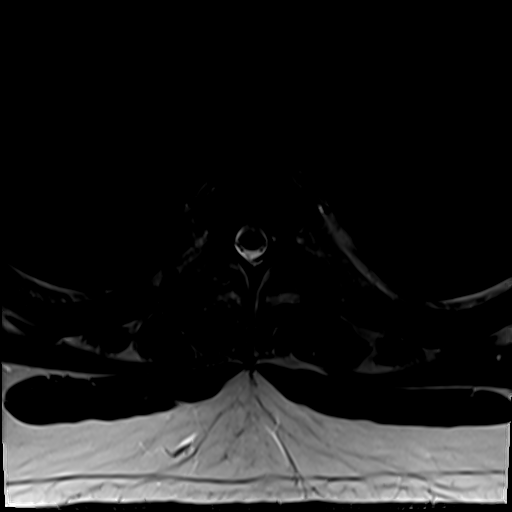
[im 30/37]
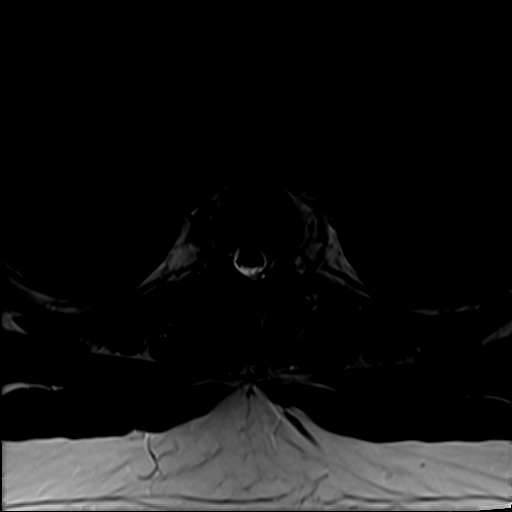
[im 33/37]
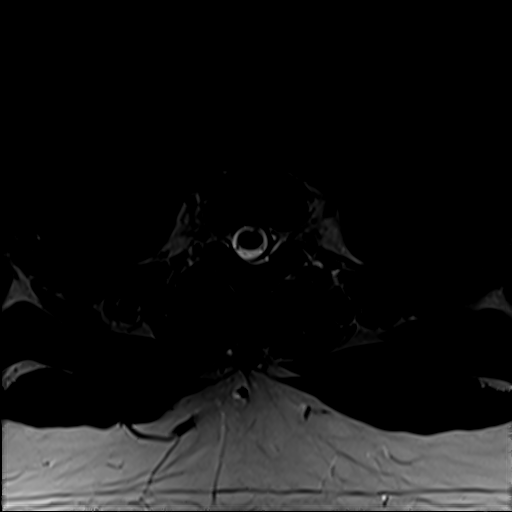
[im 37/37]
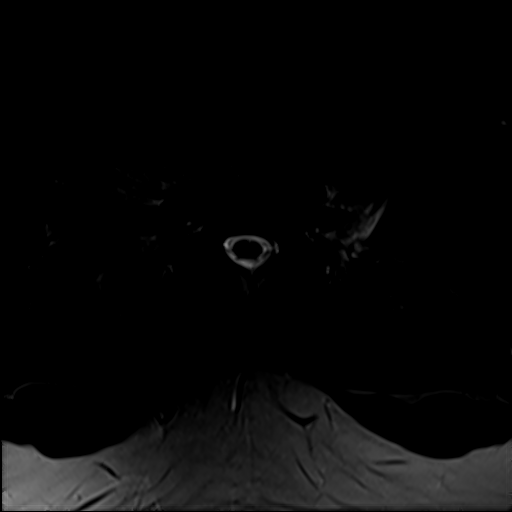

[22 of 48 positions shown; findings below may reference images not displayed]

FINDINGS: Alignment:  AP alignment is anatomic.

Vertebrae: Hemangioma is noted posteriorly at T9. A superior
endplate fracture at T7 is remote. Marrow signal and vertebral body
heights are otherwise normal.

Cord: Normal signal is present in the thoracic spinal cord to the
conus medullaris which terminates at T12-L1.

Paraspinal and other soft tissues: The paraspinous soft tissues are
unremarkable.

Disc levels:

T1-2: Negative.

T2-3:  Negative.

T3-4: A broad-based disc protrusion effaces the ventral CSF and
slightly distorts the ventral surface of the cord without abnormal
signal. The disc protrusion contributes to mild left foraminal
stenosis.

T4-5: Anterior and posterior fusion appears congenital. There is no
focal stenosis.

T5-6:  Mild disc bulging is present without significant stenosis.

T6-7: A leftward disc protrusion is present without significant
stenosis.

T7-8: A leftward disc protrusion effaces the ventral CSF. The
foramina are patent.

T8-9: Central disc protrusion effaces the ventral CSF and contacts
the cord without abnormal signal. The foramina are patent.

T9-10:  Negative.

T10-11: A central disc protrusion effaces the ventral CSF. The
foramina are patent.

T11-12: A far right lateral disc protrusion extends into the right
neural foramen. Moderate right foraminal stenosis is present,
potentially impacting the right T11 nerve root. The central canal is
patent. Bilateral facet hypertrophy contributes. Mild left foraminal
narrowing is noted.

T12-L1:  Negative.
IMPRESSION: 1. Far right lateral disc protrusion at T11-12 with moderate right
foraminal stenosis potentially impacting the right T11 nerve root
could account for the patient's symptoms.
2. Mild left foraminal narrowing at T11-12 due to facet hypertrophy.
3. Mild to moderate central canal stenosis at T7-8, T8-9, and
T10-11.
4. Moderate central canal stenosis at T3-4 without abnormal cord
signal.
5. Mild left foraminal stenosis at T3-4.

## 2018-08-27 DIAGNOSIS — R0683 Snoring: Secondary | ICD-10-CM

## 2018-08-27 DIAGNOSIS — Z23 Encounter for immunization: Secondary | ICD-10-CM

## 2018-08-27 DIAGNOSIS — R351 Nocturia: Secondary | ICD-10-CM | POA: Diagnosis not present

## 2018-08-27 DIAGNOSIS — M5136 Other intervertebral disc degeneration, lumbar region: Secondary | ICD-10-CM

## 2018-08-27 DIAGNOSIS — M4807 Spinal stenosis, lumbosacral region: Secondary | ICD-10-CM

## 2018-08-27 DIAGNOSIS — I10 Essential (primary) hypertension: Secondary | ICD-10-CM

## 2018-08-27 DIAGNOSIS — Z0001 Encounter for general adult medical examination with abnormal findings: Secondary | ICD-10-CM

## 2019-02-25 ENCOUNTER — Ambulatory Visit: Payer: PRIVATE HEALTH INSURANCE | Admitting: Internal Medicine

## 2019-04-19 ENCOUNTER — Ambulatory Visit: Payer: PRIVATE HEALTH INSURANCE | Admitting: Internal Medicine

## 2019-04-19 ENCOUNTER — Telehealth: Payer: Self-pay | Admitting: Internal Medicine

## 2019-04-19 ENCOUNTER — Other Ambulatory Visit: Payer: Self-pay

## 2019-04-19 ENCOUNTER — Encounter: Payer: Self-pay | Admitting: Internal Medicine

## 2019-04-19 VITALS — Ht 69.5 in

## 2019-04-19 DIAGNOSIS — I10 Essential (primary) hypertension: Secondary | ICD-10-CM | POA: Diagnosis not present

## 2019-04-19 DIAGNOSIS — R7309 Other abnormal glucose: Secondary | ICD-10-CM | POA: Diagnosis not present

## 2019-04-19 DIAGNOSIS — Z800 Family history of malignant neoplasm of digestive organs: Secondary | ICD-10-CM

## 2019-04-19 DIAGNOSIS — Z1211 Encounter for screening for malignant neoplasm of colon: Secondary | ICD-10-CM

## 2019-04-19 DIAGNOSIS — Z6841 Body Mass Index (BMI) 40.0 and over, adult: Secondary | ICD-10-CM

## 2019-04-19 MED ORDER — AMLODIPINE-OLMESARTAN 5-20 MG PO TABS: 1.0000 | ORAL_TABLET | Freq: Every day | ORAL | 1 refills | Status: DC

## 2019-04-19 MED ORDER — NEBIVOLOL HCL 10 MG PO TABS: 10.0000 mg | ORAL_TABLET | Freq: Every day | ORAL | 5 refills | Status: DC

## 2019-04-19 NOTE — Progress Notes (Signed)
 Virtual Visit via Video   This visit type was conducted due to national recommendations for restrictions regarding the COVID-19 Pandemic (e.g. social distancing) in an effort to limit this patient's exposure and mitigate transmission in our community.  Due to his co-morbid illnesses, this patient is at least at moderate risk for complications without adequate follow up.  This format is felt to be most appropriate for this patient at this time.  All issues noted in this document were discussed and addressed.  A limited physical exam was performed with this format.    This visit type was conducted due to national recommendations for restrictions regarding the COVID-19 Pandemic (e.g. social distancing) in an effort to limit this patient's exposure and mitigate transmission in our community.  Patients identity confirmed using two different identifiers.  This format is felt to be most appropriate for this patient at this time.  All issues noted in this document were discussed and addressed.  No physical exam was performed (except for noted visual exam findings with Video Visits).    Date:  04/19/2019   ID:  [name] Gordon, DOB 12/16/1975, MRN [id number]  Patient Location:  Home  Provider location:   Office    Chief Complaint:  Htn f/u, need refills  History of Present Illness:    [name] Gordon is a 43 y.o. male who presents via video conferencing for a telehealth visit today.    The patient does not have symptoms concerning for COVID-19 infection (fever, chills, cough, or new shortness of breath).   He presents today for virtual visit. He prefers this method of contact due to COVID-19 pandemic.  He needs bp f/u and refill of his meds.     Hypertension  This is a chronic problem. The current episode started more than 1 year ago. The problem has been gradually improving since onset. The problem is controlled. Pertinent negatives include no blurred vision, chest pain, palpitations or  shortness of breath. Risk factors for coronary artery disease include obesity, sedentary lifestyle, stress and male gender. Past treatments include ACE inhibitors, angiotensin blockers and calcium channel blockers. The current treatment provides moderate improvement. Compliance problems include exercise.      Past Medical History:  Diagnosis Date  . Arthritis   . Hypertension    Past Surgical History:  Procedure Laterality Date  . ARTHROSCOPY KNEE W/ DRILLING       Current Meds  Medication Sig  . amLODipine-olmesartan (AZOR) 5-20 MG tablet Take 1 tablet by mouth daily.  . nebivolol (BYSTOLIC) 10 MG tablet Take 1 tablet (10 mg total) by mouth daily.  . [DISCONTINUED] amLODipine-olmesartan (AZOR) 5-20 MG tablet   . [DISCONTINUED] nebivolol (BYSTOLIC) 10 MG tablet Take 10 mg by mouth daily.     Allergies:   Patient has no known allergies.   Social History   Tobacco Use  . Smoking status: Never Smoker  . Smokeless tobacco: Never Used  Substance Use Topics  . Alcohol use: No  . Drug use: No     Family Hx: The patient's family history includes Cancer in his father; Diabetes in his mother; Hypertension in his mother.  ROS:   Please see the history of present illness.    Review of Systems  Constitutional: Negative.   Eyes: Negative for blurred vision.  Respiratory: Negative.  Negative for shortness of breath.   Cardiovascular: Negative.  Negative for chest pain and palpitations.  Gastrointestinal: Negative.        Wants to be referred for colonoscopy.   Recently learned he has strong family history of cancers on his father's side. Brother passed in 40s from colon cancer. Sister had breast cancer, recently diagnosed with colon cancer.   Neurological: Negative.   Psychiatric/Behavioral: Negative.     All other systems reviewed and are negative.   Labs/Other Tests and Data Reviewed:    Recent Labs: No results found for requested labs within last 8760 hours.   Recent Lipid  Panel No results found for: CHOL, TRIG, HDL, CHOLHDL, LDLCALC, LDLDIRECT  Wt Readings from Last 3 Encounters:  05/24/16 (!) 376 lb (170.6 kg)  02/23/16 (!) 387 lb (175.5 kg)  03/27/13 (!) 362 lb (164.2 kg)     Exam:    Vital Signs:  Ht 5' 9.5" (1.765 m)   BMI 54.73 kg/m     Physical Exam  Constitutional: He is oriented to person, place, and time and well-developed, well-nourished, and in no distress.  HENT:  Head: Normocephalic and atraumatic.  Neck: Normal range of motion.  Pulmonary/Chest: Effort normal.  Neurological: He is alert and oriented to person, place, and time.  Psychiatric: Affect normal.  Nursing note and vitals reviewed.   ASSESSMENT & PLAN:   1. Essential hypertension, benign  Chronic. He will continue with current meds. He was also given refill on his bp meds and will come by to pick up samples of Bystolic, 10mg. He will rto in Oct 2020 for his next physical examination. He agrees to come in next week for labwork.   - CMP14+EGFR; Future  2. Other abnormal glucose  HIS A1C HAS BEEN ELEVATED IN THE PAST. I WILL CHECK AN A1C, BMET TODAY. HE WAS ENCOURAGED TO AVOID SUGARY BEVERAGES AND PROCESSED FOODS INCLUDNG BREADS, RICE AND PASTA.  - Hemoglobin A1c; Future  3. Morbid obesity with body mass index (BMI) of 50.0 to 59.9 in adult (HCC)  Importance of achieving optimal weight to decrease risk of cardiovascular disease and cancers was discussed with the patient in full detail. He is encouraged to start slowly - start with 10 minutes twice daily at least three to four days per week and to gradually build to 30 minutes five days weekly. He was given tips to incorporate more activity into her daily routine - take stairs when possible, park farther away from his job, grocery stores, etc.    4. Family history of colon cancer  He reports a strong paternal family history of cancer. He recently learned from his half-sister that she has colon cancer (diagnosed in her  60s) and a brother who passed in his 40s of colon cancer. I will refer him to GI for CRC screening.   - Ambulatory referral to Gastroenterology  5. Special screening for malignant neoplasm of colon  - Ambulatory referral to Gastroenterology     COVID-19 Education: The signs and symptoms of COVID-19 were discussed with the patient and how to seek care for testing (follow up with PCP or arrange E-visit).  The importance of social distancing was discussed today.  Patient Risk:   After full review of this patients clinical status, I feel that they are at least moderate risk at this time.  Time:   Today, I have spent 18 minutes/ 18 seconds with the patient with telehealth technology discussing above diagnoses.     Medication Adjustments/Labs and Tests Ordered: Current medicines are reviewed at length with the patient today.  Concerns regarding medicines are outlined above.   Tests Ordered: Orders Placed This Encounter  Procedures  . CMP14+EGFR  .   Ambulatory referral to Gastroenterology    Medication Changes: Meds ordered this encounter  Medications  . nebivolol (BYSTOLIC) 10 MG tablet    Sig: Take 1 tablet (10 mg total) by mouth daily.    Dispense:  30 tablet    Refill:  5    Disposition:  Follow up in 5 month(s)  Signed, [name], MD

## 2019-04-19 NOTE — Patient Instructions (Signed)

## 2019-04-19 NOTE — Telephone Encounter (Signed)
PT CALLED ON 5/6 REQ A VISIT FOR BP MEDS AND F/U PT CONSENTED TO VIRTUAL VISIT FOR TODAY 5/11

## 2019-04-26 ENCOUNTER — Other Ambulatory Visit: Payer: PRIVATE HEALTH INSURANCE

## 2019-04-27 ENCOUNTER — Other Ambulatory Visit: Payer: Self-pay | Admitting: Gastroenterology

## 2019-05-28 ENCOUNTER — Other Ambulatory Visit (HOSPITAL_COMMUNITY): Admission: RE | Admit: 2019-05-28 | Payer: PRIVATE HEALTH INSURANCE | Source: Ambulatory Visit

## 2019-05-28 ENCOUNTER — Other Ambulatory Visit (HOSPITAL_COMMUNITY): Payer: PRIVATE HEALTH INSURANCE

## 2019-05-29 ENCOUNTER — Other Ambulatory Visit (HOSPITAL_COMMUNITY)
Admission: RE | Admit: 2019-05-29 | Discharge: 2019-05-29 | Disposition: A | Discharge status: Home / Self Care | Payer: PRIVATE HEALTH INSURANCE | Source: Ambulatory Visit | Attending: Gastroenterology | Admitting: Gastroenterology

## 2019-05-29 DIAGNOSIS — Z1159 Encounter for screening for other viral diseases: Secondary | ICD-10-CM | POA: Insufficient documentation

## 2019-05-30 LAB — SARS CORONAVIRUS 2 (TAT 6-24 HRS): SARS Coronavirus 2: NEGATIVE

## 2019-05-31 ENCOUNTER — Encounter (HOSPITAL_COMMUNITY): Payer: Self-pay

## 2019-05-31 NOTE — Progress Notes (Signed)
Unable to reach patient at this time. Left a voicemail.

## 2019-05-31 NOTE — Progress Notes (Signed)
Cal attempted to patient prior to 06/01/2019 procedure with Dr. Levert Feinstein answer, unable to leave message.

## 2019-06-01 ENCOUNTER — Other Ambulatory Visit: Payer: Self-pay

## 2019-06-01 ENCOUNTER — Ambulatory Visit (HOSPITAL_COMMUNITY): Payer: PRIVATE HEALTH INSURANCE | Admitting: Anesthesiology

## 2019-06-01 ENCOUNTER — Ambulatory Visit (HOSPITAL_COMMUNITY)
Admission: RE | Admit: 2019-06-01 | Discharge: 2019-06-01 | Disposition: A | Discharge status: Home / Self Care | Payer: PRIVATE HEALTH INSURANCE | Attending: Gastroenterology | Admitting: Gastroenterology

## 2019-06-01 ENCOUNTER — Encounter (HOSPITAL_COMMUNITY): Payer: Self-pay

## 2019-06-01 ENCOUNTER — Encounter (HOSPITAL_COMMUNITY)
Admission: RE | Disposition: A | Discharge status: Home / Self Care | Payer: Self-pay | Source: Home / Self Care | Attending: Gastroenterology

## 2019-06-01 DIAGNOSIS — Z1211 Encounter for screening for malignant neoplasm of colon: Secondary | ICD-10-CM | POA: Diagnosis not present

## 2019-06-01 DIAGNOSIS — Z79899 Other long term (current) drug therapy: Secondary | ICD-10-CM | POA: Insufficient documentation

## 2019-06-01 DIAGNOSIS — D123 Benign neoplasm of transverse colon: Secondary | ICD-10-CM | POA: Insufficient documentation

## 2019-06-01 DIAGNOSIS — Z6841 Body Mass Index (BMI) 40.0 and over, adult: Secondary | ICD-10-CM | POA: Diagnosis not present

## 2019-06-01 DIAGNOSIS — Z800 Family history of malignant neoplasm of digestive organs: Secondary | ICD-10-CM | POA: Insufficient documentation

## 2019-06-01 DIAGNOSIS — I10 Essential (primary) hypertension: Secondary | ICD-10-CM | POA: Insufficient documentation

## 2019-06-01 HISTORY — DX: Morbid (severe) obesity due to excess calories: E66.01

## 2019-06-01 HISTORY — DX: Personal history of other diseases of the musculoskeletal system and connective tissue: Z87.39

## 2019-06-01 HISTORY — DX: Unilateral primary osteoarthritis, unspecified knee: M17.10

## 2019-06-01 HISTORY — PX: COLONOSCOPY WITH PROPOFOL: SHX5780

## 2019-06-01 HISTORY — DX: Osteoarthritis of knee, unspecified: M17.9

## 2019-06-01 HISTORY — PX: POLYPECTOMY: SHX5525

## 2019-06-01 SURGERY — COLONOSCOPY WITH PROPOFOL
Anesthesia: Monitor Anesthesia Care

## 2019-06-01 MED ORDER — PROPOFOL 500 MG/50ML IV EMUL
INTRAVENOUS | Status: DC | PRN
  Administered 2019-06-01: 125 ug/kg/min via INTRAVENOUS

## 2019-06-01 MED ORDER — SODIUM CHLORIDE 0.9 % IV SOLN: INTRAVENOUS | Status: DC

## 2019-06-01 MED ORDER — PROPOFOL 10 MG/ML IV BOLUS
INTRAVENOUS | Status: DC | PRN
  Administered 2019-06-01 (×2): 20 mg via INTRAVENOUS

## 2019-06-01 MED ORDER — PROPOFOL 10 MG/ML IV BOLUS
INTRAVENOUS | Status: AC
  Filled 2019-06-01: qty 80

## 2019-06-01 MED ORDER — LACTATED RINGERS IV SOLN
INTRAVENOUS | Status: DC
  Administered 2019-06-01: 12:00:00 via INTRAVENOUS

## 2019-06-01 MED ORDER — LIDOCAINE 2% (20 MG/ML) 5 ML SYRINGE
INTRAMUSCULAR | Status: DC | PRN
  Administered 2019-06-01: 100 mg via INTRAVENOUS

## 2019-06-01 SURGICAL SUPPLY — 22 items

## 2019-06-01 NOTE — Anesthesia Postprocedure Evaluation (Signed)
Anesthesia Post Note  Patient: [name] Gordon  Procedure(s) Performed: COLONOSCOPY WITH PROPOFOL (N/A ) BIOPSY POLYPECTOMY     Patient location during evaluation: PACU Anesthesia Type: MAC Level of consciousness: awake and alert Pain management: pain level controlled Vital Signs Assessment: post-procedure vital signs reviewed and stable Respiratory status: spontaneous breathing, nonlabored ventilation, respiratory function stable and patient connected to nasal cannula oxygen Cardiovascular status: stable and blood pressure returned to baseline Postop Assessment: no apparent nausea or vomiting Anesthetic complications: no    Last Vitals:  Vitals:   06/01/19 1310 06/01/19 1320  BP: (!) 162/95 (!) 203/115  Pulse: 74 72  Resp: (!) 21 17  Temp:    SpO2: 99% 99%    Last Pain:  Vitals:   06/01/19 1320  TempSrc:   PainSc: 0-No pain                 Adilynne Fitzwater S

## 2019-06-01 NOTE — Transfer of Care (Signed)
Immediate Anesthesia Transfer of Care Note  Patient: [name] Gordon  Procedure(s) Performed: COLONOSCOPY WITH PROPOFOL (N/A ) BIOPSY POLYPECTOMY  Patient Location: Endoscopy Unit  Anesthesia Type:MAC  Level of Consciousness: awake, alert  and oriented  Airway & Oxygen Therapy: Patient Spontanous Breathing and Patient connected to face mask oxygen  Post-op Assessment: Report given to RN and Post -op Vital signs reviewed and stable  Post vital signs: Reviewed and stable  Last Vitals:  Vitals Value Taken Time  BP    Temp    Pulse    Resp    SpO2      Last Pain:  Vitals:   06/01/19 1132  TempSrc: Oral  PainSc: 10-Worst pain ever         Complications: No apparent anesthesia complications

## 2019-06-01 NOTE — Discharge Instructions (Signed)

## 2019-06-01 NOTE — Anesthesia Preprocedure Evaluation (Signed)
Anesthesia Evaluation  Patient identified by MRN, date of birth, ID band Patient awake    Reviewed: Allergy & Precautions, NPO status , Patient's Chart, lab work & pertinent test results  Airway Mallampati: II  TM Distance: <3 FB Neck ROM: Full    Dental no notable dental hx.    Pulmonary neg pulmonary ROS,    Pulmonary exam normal breath sounds clear to auscultation + decreased breath sounds      Cardiovascular hypertension, Pt. on medications and Pt. on home beta blockers Normal cardiovascular exam Rhythm:Regular Rate:Normal     Neuro/Psych negative neurological ROS  negative psych ROS   GI/Hepatic negative GI ROS, Neg liver ROS,   Endo/Other  Morbid obesity  Renal/GU negative Renal ROS  negative genitourinary   Musculoskeletal negative musculoskeletal ROS (+)   Abdominal (+) + obese,   Peds negative pediatric ROS (+)  Hematology negative hematology ROS (+)   Anesthesia Other Findings   Reproductive/Obstetrics negative OB ROS                             Anesthesia Physical Anesthesia Plan  ASA: IV  Anesthesia Plan: MAC   Post-op Pain Management:    Induction: Intravenous  PONV Risk Score and Plan: 0  Airway Management Planned: Simple Face Mask  Additional Equipment:   Intra-op Plan:   Post-operative Plan:   Informed Consent: I have reviewed the patients History and Physical, chart, labs and discussed the procedure including the risks, benefits and alternatives for the proposed anesthesia with the patient or authorized representative who has indicated his/her understanding and acceptance.     Dental advisory given  Plan Discussed with: CRNA and Surgeon  Anesthesia Plan Comments:         Anesthesia Quick Evaluation

## 2019-06-01 NOTE — Op Note (Signed)
[facility] Patient Name: [name] Gordon Procedure Date: 06/01/2019 MRN: [id number] Attending MD: Carol Ada , MD Date of Birth: August 06, 1976 CSN: [id number] Age: 43 Admit Type: Outpatient Procedure:                Colonoscopy Indications:              Screening in patient at increased risk: Family                            history of 1st-degree relative with colorectal                            cancer before age 71 years Providers:                Carol Ada, MD, Cleda Daub, RN, Cherylynn Ridges,                            Technician, Danley Danker, CRNA Referring MD:              Medicines:                Propofol per Anesthesia Complications:            No immediate complications. Estimated Blood Loss:     Estimated blood loss: none. Procedure:                Pre-Anesthesia Assessment:                           - Prior to the procedure, a History and Physical                            was performed, and patient medications and                            allergies were reviewed. The patient's tolerance of                            previous anesthesia was also reviewed. The risks                            and benefits of the procedure and the sedation                            options and risks were discussed with the patient.                            All questions were answered, and informed consent                            was obtained. Prior Anticoagulants: The patient has                            taken no previous anticoagulant or antiplatelet  agents. ASA Grade Assessment: III - A patient with                            severe systemic disease. After reviewing the risks                            and benefits, the patient was deemed in                            satisfactory condition to undergo the procedure.                           - Sedation was administered by an anesthesia                            professional.  Deep sedation was attained.                           After obtaining informed consent, the colonoscope                            was passed under direct vision. Throughout the                            procedure, the patient's blood pressure, pulse, and                            oxygen saturations were monitored continuously. The                            CF-HQ190L ([id number]) Olympus colonoscope was                            introduced through the anus and advanced to the the                            terminal ileum. The colonoscopy was performed                            without difficulty. The patient tolerated the                            procedure well. The quality of the bowel                            preparation was excellent. The terminal ileum,                            ileocecal valve, appendiceal orifice, and rectum                            were photographed. Scope In: 24:26:83 PM Scope Out: 12:57:52 PM Scope Withdrawal Time: 0 hours 15 minutes 18 seconds  Total Procedure Duration: 0 hours 16 minutes 41 seconds  Findings:      Three sessile polyps were found in the sigmoid colon and transverse       colon. The polyps were 3 to 4 mm in size. These polyps were removed with       a cold snare. Resection and retrieval were complete.      The terminal ileum everted into the cecum. Because of the significant       eversion, cold biopsies were obtained to ensure that this was not an       atypical appearance of a polypoid lesion. Impression:               - Three 3 to 4 mm polyps in the sigmoid colon and                            in the transverse colon, removed with a cold snare.                            Resected and retrieved. Moderate Sedation:      Not Applicable - Patient had care per Anesthesia. Recommendation:           - Patient has a contact number available for                            emergencies. The signs and symptoms of potential                             delayed complications were discussed with the                            patient. Return to normal activities tomorrow.                            Written discharge instructions were provided to the                            patient.                           - Resume previous diet.                           - Continue present medications.                           - Await pathology results.                           - Repeat colonoscopy in 4 years for surveillance. Procedure Code(s):        --- Professional ---                           [phone], Colonoscopy, flexible; with removal of                            tumor(s), polyp(s), or other lesion(s) by snare  technique Diagnosis Code(s):        --- Professional ---                           Z80.0, Family history of malignant neoplasm of                            digestive organs                           K63.5, Polyp of colon CPT copyright 2019 [employer]. All rights reserved. The codes documented in this report are preliminary and upon coder review may  be revised to meet current compliance requirements. Carol Ada, MD Carol Ada, MD 06/01/2019 1:04:10 PM This report has been signed electronically. Number of Addenda: 0

## 2019-06-01 NOTE — Anesthesia Procedure Notes (Signed)
Date/Time: 06/01/2019 12:37 PM Performed by: Sharlette Dense, CRNA Oxygen Delivery Method: Simple face mask

## 2019-06-01 NOTE — H&P (Signed)
  [name] Gordon HPI: This is a 43 year old male with a family history of colon cancer in his sister.  The patient's sister is currently 56, but she had a colonoscopy 8 years prior with findings of polyps.  A miscommunication occurred and she did not follow up to have a polypectomy for a larger polyp.  She subsequently developed colon cancer.  Past Medical History:  Diagnosis Date  . Fatty liver 2017   Mild  . History of gout   . Hypertension   . Morbid obesity ([place])   . OA (osteoarthritis) of knee   . Spinal stenosis 2018    Past Surgical History:  Procedure Laterality Date  . ARTHROSCOPY KNEE W/ DRILLING      Family History  Problem Relation Age of Onset  . Hypertension Mother   . Diabetes Mother   . Cancer Father     Social History:  reports that he has never smoked. He has never used smokeless tobacco. He reports that he does not drink alcohol or use drugs.  Allergies: No Known Allergies  Medications:  Scheduled:  Continuous: . sodium chloride    . lactated ringers 20 mL/hr at 06/01/19 1144    No results found for this or any previous visit (from the past 24 hour(s)).   No results found.  ROS:  As stated above in the HPI otherwise negative.  Blood pressure (!) 212/97, pulse 70, temperature 98.5 F (36.9 C), temperature source Oral, resp. rate 17, height 5\' 10"  (1.778 m), weight (!) 175.1 kg, SpO2 99 %.    PE: Gen: NAD, Alert and Oriented HEENT:  [place]/AT, EOMI Neck: Supple, no LAD Lungs: CTA Bilaterally CV: RRR without M/G/R ABM: Soft, NTND, +BS Ext: No C/C/E  Assessment/Plan: 1) Family history of colon cancer - Colonoscopy.  [name] Gordon 06/01/2019, 12:31 PM

## 2019-06-03 ENCOUNTER — Encounter (HOSPITAL_COMMUNITY): Payer: Self-pay | Admitting: Gastroenterology

## 2019-06-14 ENCOUNTER — Ambulatory Visit: Payer: PRIVATE HEALTH INSURANCE | Admitting: Internal Medicine

## 2019-06-14 ENCOUNTER — Encounter: Payer: Self-pay | Admitting: Internal Medicine

## 2019-06-14 ENCOUNTER — Other Ambulatory Visit: Payer: Self-pay

## 2019-06-14 VITALS — BP 134/80 | HR 82 | Temp 98.6°F | Ht 70.0 in | Wt 382.0 lb

## 2019-06-14 DIAGNOSIS — Z6841 Body Mass Index (BMI) 40.0 and over, adult: Secondary | ICD-10-CM

## 2019-06-14 DIAGNOSIS — R7309 Other abnormal glucose: Secondary | ICD-10-CM | POA: Diagnosis not present

## 2019-06-14 DIAGNOSIS — I10 Essential (primary) hypertension: Secondary | ICD-10-CM

## 2019-06-14 MED ORDER — AMLODIPINE-OLMESARTAN 10-20 MG PO TABS: 1.0000 | ORAL_TABLET | Freq: Every day | ORAL | 1 refills | Status: DC

## 2019-06-14 NOTE — Progress Notes (Signed)
Subjective:     Patient ID: [name] Gordon , male    DOB: Apr 07, 1976 , 43 y.o.   MRN: [id number]   Chief Complaint  Patient presents with  . Hypertension    HPI  He is here today for a bp check. Reports compliance with meds. He is concerned because he states his bp was markedly elevated prior to his colonoscopy. He was advised that it was at "stroke level".  Hypertension This is a chronic problem. The current episode started more than 1 year ago. The problem has been gradually improving since onset. The problem is controlled. Pertinent negatives include no blurred vision, chest pain, palpitations or shortness of breath. Risk factors for coronary artery disease include male gender, obesity and sedentary lifestyle. Past treatments include ACE inhibitors, diuretics, calcium channel blockers and angiotensin blockers. The current treatment provides moderate improvement. Compliance problems include exercise.      Past Medical History:  Diagnosis Date  . Fatty liver 2017   Mild  . History of gout   . Hypertension   . Morbid obesity ([place])   . OA (osteoarthritis) of knee   . Spinal stenosis 2018     Family History  Problem Relation Age of Onset  . Hypertension Mother   . Diabetes Mother   . Cancer Father      Current Outpatient Medications:  .  cholecalciferol (VITAMIN D3) 25 MCG (1000 UT) tablet, Take 1,000 Units by mouth daily., Disp: , Rfl:  .  nebivolol (BYSTOLIC) 10 MG tablet, Take 1 tablet (10 mg total) by mouth daily., Disp: 30 tablet, Rfl: 5 .  vitamin B-12 (CYANOCOBALAMIN) 1000 MCG tablet, Take 1,000 mcg by mouth daily., Disp: , Rfl:  .  amlodipine-olmesartan (AZOR) 10-20 MG tablet, Take 1 tablet by mouth daily., Disp: 30 tablet, Rfl: 1   No Known Allergies   Review of Systems  Constitutional: Negative.   Eyes: Negative for blurred vision.  Respiratory: Negative.  Negative for shortness of breath.   Cardiovascular: Negative.  Negative for chest pain and  palpitations.  Gastrointestinal: Negative.   Neurological: Negative.   Psychiatric/Behavioral: Negative.      Today's Vitals   06/14/19 1610  BP: 134/80  Pulse: 82  Temp: 98.6 F (37 C)  TempSrc: Oral  Weight: (!) 382 lb (173.3 kg)  Height: 5' 10"  (1.778 m)   Body mass index is 54.81 kg/m.   Objective:  Physical Exam Vitals signs and nursing note reviewed.  Constitutional:      Appearance: Normal appearance. He is obese.  Cardiovascular:     Rate and Rhythm: Normal rate and regular rhythm.     Heart sounds: Normal heart sounds.  Pulmonary:     Effort: Pulmonary effort is normal.     Breath sounds: Normal breath sounds.  Skin:    General: Skin is warm.  Neurological:     General: No focal deficit present.     Mental Status: He is alert.  Psychiatric:        Mood and Affect: Mood normal.         Assessment And Plan:     1. Essential hypertension, benign  Fair control. I will increase his Azor to 10/3m once daily. He agrees to rto in six weeks for re-evaluation. I will check labs as listed below. Importance of salt restriction was discussed with the patient.  Additionally, he was encouraged to incorporate more activity into his daily routine.   - CMP14+EGFR - TSH  2. Other abnormal glucose  HIS A1C HAS BEEN ELEVATED IN THE PAST. I WILL CHECK AN A1C, BMET TODAY. HE WAS ENCOURAGED TO AVOID SUGARY BEVERAGES AND PROCESSED FOODS INCLUDNG BREADS, RICE AND PASTA.  - Hemoglobin A1c  3. Class 3 severe obesity due to excess calories with serious comorbidity and body mass index (BMI) of 50.0 to 59.9 in adult [facility])  Importance of achieving optimal weight to decrease risk of cardiovascular disease and cancers was discussed with the patient in full detail. He is encouraged to start slowly - start with 10 minutes twice daily at least three to four days per week and to gradually build to 30 minutes five days weekly. He was given tips to incorporate more activity into his  daily routine - take stairs when possible, park farther away from her job, grocery stores, etc.   [name] Greenland, MD    THE PATIENT IS ENCOURAGED TO PRACTICE SOCIAL DISTANCING DUE TO THE COVID-19 PANDEMIC.

## 2019-06-14 NOTE — Patient Instructions (Signed)

## 2019-06-15 LAB — CMP14+EGFR
ALT: 50 IU/L — ABNORMAL HIGH (ref 0–44)
AST: 29 IU/L (ref 0–40)
Albumin/Globulin Ratio: 1.9 (ref 1.2–2.2)
Albumin: 4.4 g/dL (ref 4.0–5.0)
Alkaline Phosphatase: 78 IU/L (ref 39–117)
BUN/Creatinine Ratio: 12 (ref 9–20)
BUN: 13 mg/dL (ref 6–24)
Bilirubin Total: <0.2 mg/dL (ref 0.0–1.2)
CO2: 21 mmol/L (ref 20–29)
Calcium: 9.1 mg/dL (ref 8.7–10.2)
Chloride: 104 mmol/L (ref 96–106)
Creatinine, Ser: 1.08 mg/dL (ref 0.76–1.27)
GFR calc Af Amer: 96 mL/min/{1.73_m2} (ref >59)
GFR calc non Af Amer: 83 mL/min/{1.73_m2} (ref >59)
Globulin, Total: 2.3 g/dL (ref 1.5–4.5)
Glucose: 89 mg/dL (ref 65–99)
Potassium: 3.6 mmol/L (ref 3.5–5.2)
Sodium: 142 mmol/L (ref 134–144)
Total Protein: 6.7 g/dL (ref 6.0–8.5)

## 2019-06-15 LAB — HEMOGLOBIN A1C
Est. average glucose Bld gHb Est-mCnc: 123 mg/dL
Hgb A1c MFr Bld: 5.9 % — ABNORMAL HIGH (ref 4.8–5.6)

## 2019-06-15 LAB — TSH: TSH: 2.32 u[IU]/mL (ref 0.450–4.500)

## 2019-09-20 ENCOUNTER — Encounter: Payer: PRIVATE HEALTH INSURANCE | Admitting: Internal Medicine

## 2019-09-22 ENCOUNTER — Other Ambulatory Visit: Payer: Self-pay | Admitting: Internal Medicine

## 2019-10-11 ENCOUNTER — Ambulatory Visit: Payer: PRIVATE HEALTH INSURANCE | Admitting: Podiatry

## 2019-10-12 ENCOUNTER — Encounter: Payer: PRIVATE HEALTH INSURANCE | Admitting: Nurse Practitioner

## 2019-10-14 ENCOUNTER — Ambulatory Visit: Payer: PRIVATE HEALTH INSURANCE | Admitting: Podiatry

## 2019-10-14 ENCOUNTER — Other Ambulatory Visit: Payer: Self-pay

## 2019-10-14 DIAGNOSIS — B351 Tinea unguium: Secondary | ICD-10-CM

## 2019-10-25 DIAGNOSIS — B351 Tinea unguium: Secondary | ICD-10-CM | POA: Insufficient documentation
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
## 2019-10-27 ENCOUNTER — Encounter: Payer: PRIVATE HEALTH INSURANCE | Admitting: Nurse Practitioner

## 2019-10-28 ENCOUNTER — Telehealth: Payer: Self-pay | Admitting: *Deleted

## 2019-10-28 DIAGNOSIS — Z79899 Other long term (current) drug therapy: Secondary | ICD-10-CM

## 2019-10-28 NOTE — Telephone Encounter (Signed)
Left message for pt to call back for results.

## 2019-10-28 NOTE — Telephone Encounter (Signed)
-----   Message from Trula Slade, DPM sent at 10/27/2019  5:59 PM EST ----- Val- please let him know that the culture did show fungus. We can do Lamisil but will need to recheck CBC and LFT if he elects for oral treatment. If he does not want to do oral we can do topical through [place]. Also let him know that the culture did show melanin pigment in the toenails which is likely benign given he is African American. If the darkness continues in the nail despite treatment or if there is any worsening he will need further biopsy. Thanks .

## 2019-10-29 ENCOUNTER — Telehealth: Payer: Self-pay | Admitting: *Deleted

## 2019-10-29 NOTE — Addendum Note (Signed)
Addended by: Harriett Sine D on: 10/29/2019 10:12 AM   Modules accepted: Orders

## 2019-10-29 NOTE — Telephone Encounter (Signed)
Entered in error

## 2019-10-29 NOTE — Telephone Encounter (Signed)
I informed pt of Dr. Leigh Aurora review of results and cautioned to watch the darkened area. Pt states he would like to start Lamisil therapy.

## 2019-11-08 ENCOUNTER — Encounter: Payer: Self-pay | Admitting: Nurse Practitioner

## 2019-11-08 ENCOUNTER — Ambulatory Visit: Payer: PRIVATE HEALTH INSURANCE | Admitting: Nurse Practitioner

## 2019-11-08 ENCOUNTER — Other Ambulatory Visit: Payer: Self-pay

## 2019-11-08 VITALS — BP 130/86 | HR 75 | Temp 98.4°F | Ht 69.0 in | Wt 386.2 lb

## 2019-11-08 DIAGNOSIS — Z0000 Encounter for general adult medical examination without abnormal findings: Secondary | ICD-10-CM

## 2019-11-08 DIAGNOSIS — R7309 Other abnormal glucose: Secondary | ICD-10-CM

## 2019-11-08 DIAGNOSIS — Z125 Encounter for screening for malignant neoplasm of prostate: Secondary | ICD-10-CM

## 2019-11-08 DIAGNOSIS — I10 Essential (primary) hypertension: Secondary | ICD-10-CM

## 2019-11-08 DIAGNOSIS — Z20822 Contact with and (suspected) exposure to covid-19: Secondary | ICD-10-CM

## 2019-11-08 DIAGNOSIS — Z20828 Contact with and (suspected) exposure to other viral communicable diseases: Secondary | ICD-10-CM

## 2019-11-08 DIAGNOSIS — Z6841 Body Mass Index (BMI) 40.0 and over, adult: Secondary | ICD-10-CM

## 2019-11-08 LAB — POCT URINALYSIS DIPSTICK
Bilirubin, UA: NEGATIVE
Blood, UA: NEGATIVE
Glucose, UA: NEGATIVE
Ketones, UA: NEGATIVE
Leukocytes, UA: NEGATIVE
Nitrite, UA: NEGATIVE
Protein, UA: NEGATIVE
Spec Grav, UA: 1.02 (ref 1.010–1.025)
Urobilinogen, UA: 0.2 E.U./dL
pH, UA: 7 (ref 5.0–8.0)

## 2019-11-08 LAB — POCT UA - MICROALBUMIN
Albumin/Creatinine Ratio, Urine, POC: <30
Creatinine, POC: 100 mg/dL
Microalbumin Ur, POC: 30 mg/L

## 2019-11-08 NOTE — Progress Notes (Signed)
Subjective:     Patient ID: [name] Gordon , male    DOB: 07-Nov-1976 , 43 y.o.   MRN: [id number]   Chief Complaint  Patient presents with  . Annual Exam    HPI  Here for HM  He is getting ready to have cartilage.     Hypertension This is a chronic problem. The current episode started more than 1 year ago. The problem is unchanged. The problem is controlled. Pertinent negatives include no anxiety. There are no associated agents to hypertension. Risk factors for coronary artery disease include obesity, sedentary lifestyle and male gender. Past treatments include angiotensin blockers, calcium channel blockers and lifestyle changes. There are no compliance problems.    Men's preventive visit. Patient Health Questionnaire (PHQ-2) is    Office Visit from 11/08/2019 in [facility]  PHQ-2 Total Score  0     Patient is on a Regular diet. No exercise.  Marital status: Married. Relevant history for alcohol use is:  Social History   Substance and Sexual Activity  Alcohol Use No   Relevant history for tobacco use is:  Social History   Tobacco Use  Smoking Status Never Smoker  Smokeless Tobacco Never Used   Past Medical History:  Diagnosis Date  . Fatty liver 2017   Mild  . History of gout   . Hypertension   . Morbid obesity ([place])   . OA (osteoarthritis) of knee   . Spinal stenosis 2018     Family History  Problem Relation Age of Onset  . Hypertension Mother   . Diabetes Mother   . Cancer Father      Current Outpatient Medications:  .  amlodipine-olmesartan (AZOR) 10-20 MG tablet, TAKE 1 TABLET BY MOUTH EVERY DAY, Disp: 90 tablet, Rfl: 1 .  cholecalciferol (VITAMIN D3) 25 MCG (1000 UT) tablet, Take 1,000 Units by mouth daily., Disp: , Rfl:  .  nebivolol (BYSTOLIC) 10 MG tablet, Take 1 tablet (10 mg total) by mouth daily., Disp: 30 tablet, Rfl: 5 .  vitamin B-12 (CYANOCOBALAMIN) 1000 MCG tablet, Take 1,000 mcg by mouth daily., Disp: , Rfl:     No Known Allergies   Review of Systems  Constitutional: Negative.   HENT: Negative.   Eyes: Negative.   Respiratory: Negative.   Cardiovascular: Negative.   Gastrointestinal: Negative.   Genitourinary: Negative.   Musculoskeletal: Negative.   Skin: Negative.   Neurological: Negative.   Hematological: Negative.      Today's Vitals   11/08/19 1513  BP: 130/86  Pulse: 75  Temp: 98.4 F (36.9 C)  TempSrc: Oral  Weight: (!) 386 lb 3.2 oz (175.2 kg)  Height: 5\' 9"  (1.753 m)  PainSc: 6   PainLoc: Knee   Body mass index is 57.03 kg/m.   Objective:  Physical Exam Vitals reviewed.  Constitutional:      General: He is not in acute distress.    Appearance: Normal appearance. He is obese.  HENT:     Head: Normocephalic and atraumatic.  Eyes:     Extraocular Movements: Extraocular movements intact.     Conjunctiva/sclera: Conjunctivae normal.     Pupils: Pupils are equal, round, and reactive to light.  Cardiovascular:     Rate and Rhythm: Normal rate and regular rhythm.     Pulses: Normal pulses.     Heart sounds: Normal heart sounds. No murmur.  Pulmonary:     Effort: Pulmonary effort is normal.     Breath sounds: Normal breath sounds.  Abdominal:     General: Bowel sounds are normal. There is no distension.     Palpations: Abdomen is soft.     Tenderness: There is no abdominal tenderness.  Musculoskeletal:        General: Normal range of motion.  Skin:    General: Skin is dry.     Capillary Refill: Capillary refill takes less than 2 seconds.  Neurological:     General: No focal deficit present.     Mental Status: He is alert and oriented to person, place, and time.  Psychiatric:        Mood and Affect: Mood normal.        Behavior: Behavior normal.        Thought Content: Thought content normal.        Judgment: Judgment normal.         Assessment And Plan:     1. Health maintenance examination . Behavior modifications discussed and diet history  reviewed.   . Pt will continue to exercise regularly and modify diet with low GI, plant based foods and decrease intake of processed foods.  . Recommend intake of daily multivitamin, Vitamin D, and calcium.  . Recommend for preventive screenings, as well as recommend immunizations that include influenza, TDAP  2. Essential hypertension, benign . B/P is fairly controlled.  . CMP ordered to check renal function.  . The importance of regular exercise and dietary modification was stressed to the patient.  . Stressed importance of losing ten percent of her body weight to help with B/P control.  . The weight loss would help with decreasing cardiac and cancer risk as well.  . EKG revealed NS - POCT Urinalysis Dipstick (81002) - POCT UA - Microalbumin - EKG 12-Lead - CBC no Diff  3. Class 3 severe obesity due to excess calories with serious comorbidity and body mass index (BMI) of 50.0 to 59.9 in adult [facility])  Chronic  Discussed healthy diet and regular exercise options   Encouraged to exercise at least 150 minutes per week with 2 days of strength training  4. Other abnormal glucose  Chronic, stable  No current medications  Encouraged to limit intake of sugary foods and drinks  Encouraged to increase physical activity to 150 minutes per week  5. Encounter for prostate cancer screening  - PSA   Minette Brine, FNP    THE PATIENT IS ENCOURAGED TO PRACTICE SOCIAL DISTANCING DUE TO THE COVID-19 PANDEMIC.

## 2019-11-09 ENCOUNTER — Ambulatory Visit: Payer: PRIVATE HEALTH INSURANCE

## 2019-11-09 LAB — CBC
Hematocrit: 44.4 % (ref 37.5–51.0)
Hemoglobin: 14.6 g/dL (ref 13.0–17.7)
MCH: 27.5 pg (ref 26.6–33.0)
MCHC: 32.9 g/dL (ref 31.5–35.7)
MCV: 84 fL (ref 79–97)
Platelets: 367 10*3/uL (ref 150–450)
RBC: 5.3 x10E6/uL (ref 4.14–5.80)
RDW: 13.8 % (ref 11.6–15.4)
WBC: 10.3 10*3/uL (ref 3.4–10.8)

## 2019-11-09 LAB — PSA: Prostate Specific Ag, Serum: 2.8 ng/mL (ref 0.0–4.0)

## 2019-11-11 LAB — NOVEL CORONAVIRUS, NAA: SARS-CoV-2, NAA: NOT DETECTED

## 2019-11-12 ENCOUNTER — Telehealth: Payer: Self-pay

## 2019-11-12 NOTE — Telephone Encounter (Signed)
I left a detailed message at the pt's request.

## 2019-11-12 NOTE — Telephone Encounter (Signed)
-----   Message from Glendale Chard, MD sent at 11/11/2019  7:56 PM EST ----- Neg for COVID

## 2019-11-25 NOTE — Patient Instructions (Signed)

## 2019-11-30 LAB — COMPREHENSIVE METABOLIC PANEL
ALT: 48 IU/L — ABNORMAL HIGH (ref 0–44)
AST: 30 IU/L (ref 0–40)
Albumin/Globulin Ratio: 1.6 (ref 1.2–2.2)
Albumin: 4.4 g/dL (ref 4.0–5.0)
Alkaline Phosphatase: 93 IU/L (ref 39–117)
BUN/Creatinine Ratio: 12 (ref 9–20)
BUN: 13 mg/dL (ref 6–24)
Bilirubin Total: <0.2 mg/dL (ref 0.0–1.2)
CO2: 20 mmol/L (ref 20–29)
Calcium: 9.4 mg/dL (ref 8.7–10.2)
Chloride: 97 mmol/L (ref 96–106)
Creatinine, Ser: 1.06 mg/dL (ref 0.76–1.27)
GFR calc Af Amer: 97 mL/min/{1.73_m2} (ref >59)
GFR calc non Af Amer: 84 mL/min/{1.73_m2} (ref >59)
Globulin, Total: 2.8 g/dL (ref 1.5–4.5)
Glucose: 83 mg/dL (ref 65–99)
Potassium: 4.1 mmol/L (ref 3.5–5.2)
Sodium: 135 mmol/L (ref 134–144)
Total Protein: 7.2 g/dL (ref 6.0–8.5)

## 2019-11-30 LAB — HGB A1C W/O EAG: Hgb A1c MFr Bld: 6.2 % — ABNORMAL HIGH (ref 4.8–5.6)

## 2019-11-30 LAB — SPECIMEN STATUS REPORT

## 2019-12-18 ENCOUNTER — Other Ambulatory Visit: Payer: Self-pay | Admitting: Internal Medicine

## 2019-12-29 ENCOUNTER — Telehealth: Payer: Self-pay

## 2019-12-29 NOTE — Telephone Encounter (Signed)
The pt's wife Mrs. Glade was notified that the pt needed to have a covid test because the pt is having joint pain and he has had temperature readings of 101 and 100.6.  Mrs. Scarfone was told that she shouldn't get tested before Friday unless she starts having symptoms.

## 2019-12-31 ENCOUNTER — Other Ambulatory Visit: Payer: PRIVATE HEALTH INSURANCE

## 2019-12-31 ENCOUNTER — Ambulatory Visit: Payer: PRIVATE HEALTH INSURANCE | Attending: Internal Medicine

## 2019-12-31 DIAGNOSIS — Z20822 Contact with and (suspected) exposure to covid-19: Secondary | ICD-10-CM

## 2020-01-01 ENCOUNTER — Other Ambulatory Visit: Payer: Self-pay | Admitting: Internal Medicine

## 2020-01-01 LAB — NOVEL CORONAVIRUS, NAA: SARS-CoV-2, NAA: NOT DETECTED

## 2020-01-01 MED ORDER — NITROFURANTOIN MONOHYD MACRO 100 MG PO CAPS: 100.0000 mg | ORAL_CAPSULE | Freq: Two times a day (BID) | ORAL | 0 refills | Status: AC

## 2020-01-03 ENCOUNTER — Encounter: Payer: Self-pay | Admitting: Internal Medicine

## 2020-01-03 ENCOUNTER — Other Ambulatory Visit: Payer: Self-pay

## 2020-01-03 ENCOUNTER — Ambulatory Visit (INDEPENDENT_AMBULATORY_CARE_PROVIDER_SITE_OTHER): Payer: PRIVATE HEALTH INSURANCE | Admitting: Internal Medicine

## 2020-01-03 VITALS — BP 130/78 | HR 74 | Temp 98.6°F | Ht 69.0 in | Wt 378.0 lb

## 2020-01-03 DIAGNOSIS — R309 Painful micturition, unspecified: Secondary | ICD-10-CM

## 2020-01-03 DIAGNOSIS — N3001 Acute cystitis with hematuria: Secondary | ICD-10-CM

## 2020-01-03 DIAGNOSIS — Z6841 Body Mass Index (BMI) 40.0 and over, adult: Secondary | ICD-10-CM | POA: Diagnosis not present

## 2020-01-03 LAB — POCT URINALYSIS DIPSTICK
Bilirubin, UA: NEGATIVE
Glucose, UA: NEGATIVE
Ketones, UA: NEGATIVE
Leukocytes, UA: NEGATIVE
Nitrite, UA: NEGATIVE
Protein, UA: POSITIVE — AB
Spec Grav, UA: >=1.03 — AB (ref 1.010–1.025)
Urobilinogen, UA: 0.2 E.U./dL
pH, UA: 6 (ref 5.0–8.0)

## 2020-01-03 MED ORDER — CEFTRIAXONE SODIUM 500 MG IJ SOLR
500.0000 mg | Freq: Once | INTRAMUSCULAR | Status: AC
  Administered 2020-01-03: 12:00:00 500 mg via INTRAMUSCULAR

## 2020-01-03 NOTE — Patient Instructions (Signed)

## 2020-01-04 DIAGNOSIS — Z6841 Body Mass Index (BMI) 40.0 and over, adult: Secondary | ICD-10-CM | POA: Insufficient documentation

## 2020-01-04 NOTE — Progress Notes (Signed)
This visit occurred during the SARS-CoV-2 public health emergency.  Safety protocols were in place, including screening questions prior to the visit, additional usage of staff PPE, and extensive cleaning of exam room while observing appropriate contact time as indicated for disinfecting solutions.  Subjective:     Patient ID: [name] Gordon , male    DOB: 12/16/1975 , 44 y.o.   MRN: [id number]   Chief Complaint  Patient presents with  . Urinary Frequency    painful urination    HPI  He presents today for further evaluation of dysuria and urinary frequency. He contacted MD over weekend with same symptoms. He was given rx abx for suspected UTI. He reports his sx have improved since starting the medication. He reports last Wednesday, he developed fever/chills with temp 101.8 at work. He then developed body aches. On Friday, he saw blood in his urine. He denies cough and SOB. He had COVID test on Friday, which he found out was negative on Sunday. He denies n/v/d.   Urinary Frequency  This is a new problem. The current episode started in the past 7 days. The problem has been gradually improving. The maximum temperature recorded prior to his arrival was 101 - 101.9 F. The fever has been present for 1 - 2 days. He is sexually active. There is no history of pyelonephritis. Associated symptoms include chills, flank pain and frequency.     Past Medical History:  Diagnosis Date  . Fatty liver 2017   Mild  . History of gout   . Hypertension   . Morbid obesity ([place])   . OA (osteoarthritis) of knee   . Spinal stenosis 2018     Family History  Problem Relation Age of Onset  . Hypertension Mother   . Diabetes Mother   . Cancer Father      Current Outpatient Medications:  .  amlodipine-olmesartan (AZOR) 10-20 MG tablet, TAKE 1 TABLET BY MOUTH EVERY DAY, Disp: 30 tablet, Rfl: 5 .  cholecalciferol (VITAMIN D3) 25 MCG (1000 UT) tablet, Take 1,000 Units by mouth daily., Disp: , Rfl:  .   nebivolol (BYSTOLIC) 10 MG tablet, Take 1 tablet (10 mg total) by mouth daily., Disp: 30 tablet, Rfl: 5 .  nitrofurantoin, macrocrystal-monohydrate, (MACROBID) 100 MG capsule, Take 1 capsule (100 mg total) by mouth 2 (two) times daily for 7 days., Disp: 14 capsule, Rfl: 0 .  vitamin B-12 (CYANOCOBALAMIN) 1000 MCG tablet, Take 1,000 mcg by mouth daily., Disp: , Rfl:    No Known Allergies   Review of Systems  Constitutional: Positive for chills.  Respiratory: Negative.   Cardiovascular: Negative.   Gastrointestinal: Negative.   Genitourinary: Positive for flank pain and frequency.  Neurological: Negative.   Psychiatric/Behavioral: Negative.      Today's Vitals   01/03/20 1059  BP: 130/78  Pulse: 74  Temp: 98.6 F (37 C)  Weight: (!) 378 lb (171.5 kg)  Height: 5\' 9"  (1.753 m)   Body mass index is 55.82 kg/m.   Objective:  Physical Exam Vitals and nursing note reviewed.  Constitutional:      Appearance: Normal appearance.  Cardiovascular:     Rate and Rhythm: Normal rate and regular rhythm.     Heart sounds: Normal heart sounds.  Pulmonary:     Effort: Pulmonary effort is normal.     Breath sounds: Normal breath sounds.  Skin:    General: Skin is warm.  Neurological:     General: No focal deficit present.  Mental Status: He is alert.  Psychiatric:        Mood and Affect: Mood normal.         Assessment And Plan:     1. Painful urination  I will check urinalysis.   - POCT Urinalysis Dipstick (81002)  2. Acute cystitis with hematuria  I suspect this is in the setting of nephrolithiasis. He is encouraged to increase his water intake and to take full course of abx. I will check urine culture today. He is encouraged to cut back on his intake of sweet tea. He was also given Rocephin, 500mg  IM x1. All questions were answered to his satisfaction.   - Culture, Urine - cefTRIAXone (ROCEPHIN) injection 500 mg    3. Class 3 severe obesity due to excess calories  with serious comorbidity and body mass index (BMI) of 50.0 to 59.9 in adult [facility])  Wt Readings from Last 3 Encounters:  01/03/20 (!) 378 lb (171.5 kg)  11/08/19 (!) 386 lb 3.2 oz (175.2 kg)  06/14/19 (!) 382 lb (173.3 kg)   BMI 55. He was congratulated on his 8 pound weight loss since Nov 2020. He is encouraged to incorporate more exercise into his daily routine. He is encouraged to lose ten percent of his body weight to decrease cardiac risk.   [name], MD    THE PATIENT IS ENCOURAGED TO PRACTICE SOCIAL DISTANCING DUE TO THE COVID-19 PANDEMIC.

## 2020-01-05 ENCOUNTER — Telehealth: Payer: Self-pay

## 2020-01-05 NOTE — Telephone Encounter (Signed)
The pt was told that Dr. Baird Cancer wanted to know if the pt wanted to see a urologist.  The pt said he wanted to hold off on a urology referral and if it doesn't get better or it gets worse he will call back for the referral.

## 2020-01-05 NOTE — Telephone Encounter (Signed)
Left vm for pt to return call. Need to know if pt wants to see urologist.

## 2020-03-08 ENCOUNTER — Ambulatory Visit: Payer: Self-pay | Admitting: Internal Medicine

## 2020-03-15 ENCOUNTER — Ambulatory Visit: Payer: Self-pay | Admitting: Internal Medicine

## 2020-03-16 ENCOUNTER — Ambulatory Visit: Payer: PRIVATE HEALTH INSURANCE | Admitting: Internal Medicine

## 2020-03-21 ENCOUNTER — Ambulatory Visit: Payer: PRIVATE HEALTH INSURANCE | Admitting: Internal Medicine

## 2020-03-23 ENCOUNTER — Other Ambulatory Visit: Payer: Self-pay

## 2020-03-23 ENCOUNTER — Ambulatory Visit (INDEPENDENT_AMBULATORY_CARE_PROVIDER_SITE_OTHER): Payer: PRIVATE HEALTH INSURANCE | Admitting: Internal Medicine

## 2020-03-23 VITALS — BP 148/88 | HR 79 | Temp 98.3°F | Ht 68.8 in | Wt 386.4 lb

## 2020-03-23 DIAGNOSIS — R7309 Other abnormal glucose: Secondary | ICD-10-CM | POA: Diagnosis not present

## 2020-03-23 DIAGNOSIS — M542 Cervicalgia: Secondary | ICD-10-CM

## 2020-03-23 DIAGNOSIS — I10 Essential (primary) hypertension: Secondary | ICD-10-CM | POA: Diagnosis not present

## 2020-03-23 DIAGNOSIS — M545 Low back pain, unspecified: Secondary | ICD-10-CM

## 2020-03-23 MED ORDER — CYCLOBENZAPRINE HCL 10 MG PO TABS: ORAL_TABLET | ORAL | 0 refills | Status: DC

## 2020-03-23 NOTE — Patient Instructions (Signed)
Calm- magnesium supplement one tsp nightly
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
## 2020-03-24 LAB — CMP14+EGFR
ALT: 40 IU/L (ref 0–44)
AST: 27 IU/L (ref 0–40)
Albumin/Globulin Ratio: 1.7 (ref 1.2–2.2)
Albumin: 4.4 g/dL (ref 4.0–5.0)
Alkaline Phosphatase: 94 IU/L (ref 39–117)
BUN/Creatinine Ratio: 10 (ref 9–20)
BUN: 10 mg/dL (ref 6–24)
Bilirubin Total: 0.3 mg/dL (ref 0.0–1.2)
CO2: 21 mmol/L (ref 20–29)
Calcium: 9.1 mg/dL (ref 8.7–10.2)
Chloride: 104 mmol/L (ref 96–106)
Creatinine, Ser: 0.99 mg/dL (ref 0.76–1.27)
GFR calc Af Amer: 106 mL/min/{1.73_m2} (ref >59)
GFR calc non Af Amer: 92 mL/min/{1.73_m2} (ref >59)
Globulin, Total: 2.6 g/dL (ref 1.5–4.5)
Glucose: 82 mg/dL (ref 65–99)
Potassium: 4.3 mmol/L (ref 3.5–5.2)
Sodium: 144 mmol/L (ref 134–144)
Total Protein: 7 g/dL (ref 6.0–8.5)

## 2020-03-24 LAB — HEMOGLOBIN A1C
Est. average glucose Bld gHb Est-mCnc: 123 mg/dL
Hgb A1c MFr Bld: 5.9 % — ABNORMAL HIGH (ref 4.8–5.6)

## 2020-03-26 ENCOUNTER — Encounter: Payer: Self-pay | Admitting: Internal Medicine

## 2020-05-01 ENCOUNTER — Other Ambulatory Visit: Payer: Self-pay | Admitting: Internal Medicine

## 2020-06-22 ENCOUNTER — Encounter: Payer: Self-pay | Admitting: Internal Medicine

## 2020-06-22 ENCOUNTER — Ambulatory Visit (INDEPENDENT_AMBULATORY_CARE_PROVIDER_SITE_OTHER): Payer: PRIVATE HEALTH INSURANCE | Admitting: Internal Medicine

## 2020-06-22 ENCOUNTER — Other Ambulatory Visit: Payer: Self-pay

## 2020-06-22 VITALS — BP 132/96 | HR 90 | Temp 98.1°F | Ht 68.8 in | Wt 392.2 lb

## 2020-06-22 DIAGNOSIS — R7309 Other abnormal glucose: Secondary | ICD-10-CM | POA: Diagnosis not present

## 2020-06-22 DIAGNOSIS — M170 Bilateral primary osteoarthritis of knee: Secondary | ICD-10-CM

## 2020-06-22 DIAGNOSIS — I10 Essential (primary) hypertension: Secondary | ICD-10-CM

## 2020-06-22 DIAGNOSIS — Z6841 Body Mass Index (BMI) 40.0 and over, adult: Secondary | ICD-10-CM

## 2020-06-22 MED ORDER — HYDROCODONE-ACETAMINOPHEN 5-325 MG PO TABS: 1.0000 | ORAL_TABLET | Freq: Four times a day (QID) | ORAL | 0 refills | Status: DC | PRN

## 2020-06-22 NOTE — Progress Notes (Signed)
This visit occurred during the SARS-CoV-2 public health emergency.  Safety protocols were in place, including screening questions prior to the visit, additional usage of staff PPE, and extensive cleaning of exam room while observing appropriate contact time as indicated for disinfecting solutions.  Subjective:     Patient ID: [name] Gordon , male    DOB: 10-21-1976 , 44 y.o.   MRN: [id number]   Chief Complaint  Patient presents with   Hypertension    HPI  The patient is here for a follow-up on his blood pressure.  He reports compliance with his meds.  Hypertension This is a chronic problem. The current episode started more than 1 year ago. The problem is unchanged. The problem is uncontrolled. Pertinent negatives include no blurred vision or neck pain. Risk factors for coronary artery disease include obesity. The current treatment provides moderate improvement. Compliance problems include exercise.      Past Medical History:  Diagnosis Date   Fatty liver 2017   Mild   History of gout    Hypertension    Morbid obesity (HCC)    OA (osteoarthritis) of knee    Spinal stenosis 2018     Family History  Problem Relation Age of Onset   Hypertension Mother    Diabetes Mother    Cancer Father      Current Outpatient Medications:    amlodipine-olmesartan (AZOR) 10-20 MG tablet, TAKE 1 TABLET BY MOUTH EVERY DAY, Disp: 30 tablet, Rfl: 5   BYSTOLIC 10 MG tablet, TAKE 1 TABLET BY MOUTH EVERY DAY, Disp: 30 tablet, Rfl: 5   cholecalciferol (VITAMIN D3) 25 MCG (1000 UT) tablet, Take 1,000 Units by mouth daily., Disp: , Rfl:    cyclobenzaprine (FLEXERIL) 10 MG tablet, One tab po qhs prn, Disp: 30 tablet, Rfl: 0   HYDROcodone-acetaminophen (NORCO/VICODIN) 5-325 MG tablet, Take 1 tablet by mouth every 6 (six) hours as needed for moderate pain., Disp: 30 tablet, Rfl: 0   No Known Allergies   Review of Systems  Constitutional: Negative.   Eyes: Negative for blurred  vision.  Respiratory: Negative.   Cardiovascular: Negative.   Gastrointestinal: Negative.   Musculoskeletal: Positive for arthralgias. Negative for neck pain.       He c/o b/l knee pain.  There is pain with ambulation. He has been seen by Ortho. Needs TKR. However, this can't be performed until he has some weight loss.   Psychiatric/Behavioral: Negative.   All other systems reviewed and are negative.    Today's Vitals   06/22/20 1625  BP: (!) 132/96  Pulse: 90  Temp: 98.1 F (36.7 C)  TempSrc: Oral  Weight: (!) 392 lb 3.2 oz (177.9 kg)  Height: 5' 8.8" (1.748 m)   Body mass index is 58.26 kg/m.  Wt Readings from Last 3 Encounters:  06/22/20 (!) 392 lb 3.2 oz (177.9 kg)  03/23/20 (!) 386 lb 6.4 oz (175.3 kg)  01/03/20 (!) 378 lb (171.5 kg)   Objective:  Physical Exam Vitals and nursing note reviewed.  Constitutional:      Appearance: Normal appearance. He is obese.  HENT:     Head: Normocephalic and atraumatic.  Cardiovascular:     Rate and Rhythm: Normal rate and regular rhythm.     Heart sounds: Normal heart sounds.  Pulmonary:     Breath sounds: Normal breath sounds.  Musculoskeletal:     Comments: Knee braces on both knees  Skin:    General: Skin is warm.  Neurological:  General: No focal deficit present.     Mental Status: He is alert and oriented to person, place, and time.         Assessment And Plan:     1. Essential hypertension, benign  Chronic, fair control. States he rushed to appointment today. He thought he was going to be late. He will continue with current meds for now. He is encouraged to incorporate more exercise into his daily routine.   2. Class 3 severe obesity due to excess calories with serious comorbidity and body mass index (BMI) of 50.0 to 59.9 in adult [facility])  He has gained fourteen pounds since Jan 2021. We discussed the use of Wegovy to address obesity. He confirms there is no personal/family h/o thyroid cancer. He was instructed  on how to administer the medication. Possible side effects including nausea, constipation and diarrhea were discussed with the patient. Its association with medullary thyroid carcinoma was also discussed. He is reminded to stop eating when full. He will start with 0.25mg  x 6 weeks. He will rto in six weeks for re-evaluation.  All questions were answered to his satisfaction. He is in agreement with his treatment plan. Additionally, he also expresses interest in weight loss surgery. I will refer him to [facility] for consultation.   - Amb Referral to Bariatric Surgery  3. Other abnormal glucose  HIS A1C HAS BEEN ELEVATED IN THE PAST. I WILL CHECK AN A1C, BMET TODAY. HE WAS ENCOURAGED TO AVOID SUGARY BEVERAGES AND PROCESSED FOODS INCLUDNG BREADS, RICE AND PASTA.  - Insulin, random(561) - Hemoglobin A1c  4. Primary osteoarthritis of both knees  Chronic. He was given rx Vicodin to use prn. Review of the [place] CSRS was performed in accordance of the [place] prior to dispensing any controlled drugs.    Patient was given opportunity to ask questions. Patient verbalized understanding of the plan and was able to repeat key elements of the plan. All questions were answered to their satisfaction.  [name], MD   I, [name], MD, have reviewed all documentation for this visit. The documentation on 06/23/20 for the exam, diagnosis, procedures, and orders are all accurate and complete.  THE PATIENT IS ENCOURAGED TO PRACTICE SOCIAL DISTANCING DUE TO THE COVID-19 PANDEMIC.

## 2020-06-23 LAB — INSULIN, RANDOM: INSULIN: 177 u[IU]/mL — ABNORMAL HIGH (ref 2.6–24.9)

## 2020-06-23 LAB — HEMOGLOBIN A1C
Est. average glucose Bld gHb Est-mCnc: 123 mg/dL
Hgb A1c MFr Bld: 5.9 % — ABNORMAL HIGH (ref 4.8–5.6)

## 2020-08-08 ENCOUNTER — Encounter: Payer: Self-pay | Admitting: Internal Medicine

## 2020-08-08 ENCOUNTER — Other Ambulatory Visit: Payer: Self-pay

## 2020-08-08 ENCOUNTER — Ambulatory Visit (INDEPENDENT_AMBULATORY_CARE_PROVIDER_SITE_OTHER): Payer: PRIVATE HEALTH INSURANCE | Admitting: Internal Medicine

## 2020-08-08 ENCOUNTER — Other Ambulatory Visit: Payer: Self-pay | Admitting: Internal Medicine

## 2020-08-08 VITALS — BP 140/82 | HR 102 | Temp 98.2°F | Ht 67.6 in | Wt 387.2 lb

## 2020-08-08 DIAGNOSIS — I10 Essential (primary) hypertension: Secondary | ICD-10-CM

## 2020-08-08 DIAGNOSIS — Z6841 Body Mass Index (BMI) 40.0 and over, adult: Secondary | ICD-10-CM

## 2020-08-08 MED ORDER — WEGOVY 1 MG/0.5ML ~~LOC~~ SOAJ: 1.0000 mg | SUBCUTANEOUS | 0 refills | Status: DC

## 2020-08-08 NOTE — Progress Notes (Signed)
I,Tianna Badgett,acting as a Education administrator for [name], MD.,have documented all relevant documentation on the behalf of [name], MD,as directed by  [name], MD while in the presence of [name], MD.  This visit occurred during the SARS-CoV-2 public health emergency.  Safety protocols were in place, including screening questions prior to the visit, additional usage of staff PPE, and extensive cleaning of exam room while observing appropriate contact time as indicated for disinfecting solutions.  Subjective:     Patient ID: [name] Gordon , male    DOB: 01/01/1976 , 44 y.o.   MRN: [id number]   Chief Complaint  Patient presents with  . Hypertension    HPI  Patient is here for Ozempic follow up. States that he feels fine on medication. He will be switched to [facility] once it is available. He admits he is not yet exercising on a regular basis.     Past Medical History:  Diagnosis Date  . Fatty liver 2017   Mild  . History of gout   . Hypertension   . Morbid obesity ([place])   . OA (osteoarthritis) of knee   . Spinal stenosis 2018     Family History  Problem Relation Age of Onset  . Hypertension Mother   . Diabetes Mother   . Cancer Father      Current Outpatient Medications:  .  amlodipine-olmesartan (AZOR) 10-20 MG tablet, TAKE 1 TABLET BY MOUTH EVERY DAY, Disp: 30 tablet, Rfl: 5 .  BYSTOLIC 10 MG tablet, TAKE 1 TABLET BY MOUTH EVERY DAY, Disp: 30 tablet, Rfl: 5 .  cholecalciferol (VITAMIN D3) 25 MCG (1000 UT) tablet, Take 1,000 Units by mouth daily., Disp: , Rfl:  .  HYDROcodone-acetaminophen (NORCO/VICODIN) 5-325 MG tablet, Take 1 tablet by mouth every 6 (six) hours as needed for moderate pain., Disp: 30 tablet, Rfl: 0 .  Semaglutide,0.25 or 0.5MG /DOS, (OZEMPIC, 0.25 OR 0.5 MG/DOSE,) 2 MG/1.5ML SOPN, Inject 0.25 mg into the skin., Disp: , Rfl:  .  Semaglutide-Weight Management (WEGOVY) 1 MG/0.5ML SOAJ, Inject 0.5 mLs (1 mg total) into the skin once a  week., Disp: 2 mL, Rfl: 0   No Known Allergies   Review of Systems  Constitutional: Negative.   Respiratory: Negative.   Cardiovascular: Negative.   Gastrointestinal: Negative.   Neurological: Negative.      Today's Vitals   08/08/20 1625  BP: 140/82  Pulse: (!) 102  Temp: 98.2 F (36.8 C)  TempSrc: Oral  Weight: (!) 387 lb 3.2 oz (175.6 kg)  Height: 5' 7.6" (1.717 m)   Body mass index is 59.57 kg/m.   Objective:  Physical Exam Vitals and nursing note reviewed.  Constitutional:      Appearance: Normal appearance. He is obese.  Cardiovascular:     Rate and Rhythm: Normal rate and regular rhythm.     Heart sounds: Normal heart sounds.  Pulmonary:     Effort: Pulmonary effort is normal.     Breath sounds: Normal breath sounds.  Skin:    General: Skin is warm.  Neurological:     General: No focal deficit present.     Mental Status: He is alert.  Psychiatric:        Mood and Affect: Mood normal.         Assessment And Plan:     1. Class 3 severe obesity due to excess calories with serious comorbidity and body mass index (BMI) of 50.0 to 59.9 in adult [facility]) Comments: He  was congratulated on losing 5 pounds since his last visit. He is encouraged to incorporate more exercise into his daily routine. He will start Ozempic, 0.5mg  once weekly x 4 weeks. A rx Wegovy 1mg  once weekly will be sent to his local pharmacy.   2. Essential hypertension, benign Comments: Chronic, fair control. He will continue with current meds. Encouraged to aovid adding salt to his foods.      Patient was given opportunity to ask questions. Patient verbalized understanding of the plan and was able to repeat key elements of the plan. All questions were answered to their satisfaction.  [name], MD   I, [name], MD, have reviewed all documentation for this visit. The documentation on 08/14/20 for the exam, diagnosis, procedures, and orders are all accurate and complete.  THE  PATIENT IS ENCOURAGED TO PRACTICE SOCIAL DISTANCING DUE TO THE COVID-19 PANDEMIC.

## 2020-08-08 NOTE — Patient Instructions (Signed)

## 2020-08-09 MED FILL — WEGOVY 1 MG/0.5ML SOAJ: 1 | 28 days supply | Qty: 2 | Fill #0

## 2020-08-28 MED FILL — WEGOVY 1 MG/0.5ML SOAJ: 1 | 28 days supply | Qty: 2 | Fill #0

## 2020-09-21 ENCOUNTER — Other Ambulatory Visit: Payer: Self-pay

## 2020-09-21 ENCOUNTER — Ambulatory Visit (INDEPENDENT_AMBULATORY_CARE_PROVIDER_SITE_OTHER): Payer: BC Managed Care – PPO | Admitting: Internal Medicine

## 2020-09-21 ENCOUNTER — Encounter: Payer: Self-pay | Admitting: Internal Medicine

## 2020-09-21 VITALS — BP 124/86 | HR 94 | Temp 97.6°F | Ht 67.6 in | Wt 384.4 lb

## 2020-09-21 DIAGNOSIS — Z6841 Body Mass Index (BMI) 40.0 and over, adult: Secondary | ICD-10-CM | POA: Diagnosis not present

## 2020-09-21 DIAGNOSIS — R7309 Other abnormal glucose: Secondary | ICD-10-CM

## 2020-09-21 DIAGNOSIS — I10 Essential (primary) hypertension: Secondary | ICD-10-CM

## 2020-09-21 DIAGNOSIS — Z23 Encounter for immunization: Secondary | ICD-10-CM | POA: Diagnosis not present

## 2020-09-21 NOTE — Progress Notes (Signed)
I,[name] Gordon,acting as a Education administrator for [name] Greenland, MD.,have documented all relevant documentation on the behalf of [name] Greenland, MD,as directed by  [name] Greenland, MD while in the presence of [name] Greenland, MD.  This visit occurred during the SARS-CoV-2 public health emergency.  Safety protocols were in place, including screening questions prior to the visit, additional usage of staff PPE, and extensive cleaning of exam room while observing appropriate contact time as indicated for disinfecting solutions.  Subjective:     Patient ID: [name] Gordon , male    DOB: 1976-06-05 , 44 y.o.   MRN: [id number]   Chief Complaint  Patient presents with  . Obesity    HPI  Patient is here for [name] Gordon follow up. States that he feels fine on medication. He reports he has quit one of his jobs, it was getting to be too much. He will now have time to exercise. He plans on working out with his coworkers.     Past Medical History:  Diagnosis Date  . Fatty liver 2017   Mild  . History of gout   . Hypertension   . Morbid obesity ([place])   . OA (osteoarthritis) of knee   . Spinal stenosis 2018     Family History  Problem Relation Age of Onset  . Hypertension Mother   . Diabetes Mother   . Cancer Father      Current Outpatient Medications:  .  amlodipine-olmesartan (AZOR) 10-20 MG tablet, TAKE 1 TABLET BY MOUTH EVERY DAY, Disp: 30 tablet, Rfl: 5 .  BYSTOLIC 10 MG tablet, TAKE 1 TABLET BY MOUTH EVERY DAY, Disp: 30 tablet, Rfl: 5 .  cholecalciferol (VITAMIN D3) 25 MCG (1000 UT) tablet, Take 1,000 Units by mouth daily., Disp: , Rfl:  .  HYDROcodone-acetaminophen (NORCO/VICODIN) 5-325 MG tablet, Take 1 tablet by mouth every 6 (six) hours as needed for moderate pain., Disp: 30 tablet, Rfl: 0 .  Semaglutide-Weight Management (WEGOVY) 1 MG/0.5ML SOAJ, Inject 0.5 mLs (1 mg total) into the skin once a week., Disp: 2 mL, Rfl: 0   No Known Allergies   Review of Systems  Constitutional:  Negative.   Respiratory: Negative.   Cardiovascular: Negative.   Gastrointestinal: Negative.   Psychiatric/Behavioral: Negative.   All other systems reviewed and are negative.    Today's Vitals   09/21/20 1635  BP: 124/86  Pulse: 94  Temp: 97.6 F (36.4 C)  TempSrc: Oral  Weight: (!) 384 lb 6.4 oz (174.4 kg)  Height: 5' 7.6" (1.717 m)   Body mass index is 59.14 kg/m.  Wt Readings from Last 3 Encounters:  09/21/20 (!) 384 lb 6.4 oz (174.4 kg)  08/08/20 (!) 387 lb 3.2 oz (175.6 kg)  06/22/20 (!) 392 lb 3.2 oz (177.9 kg)   Objective:  Physical Exam Vitals and nursing note reviewed.  Constitutional:      Appearance: Normal appearance. He is obese.  HENT:     Head: Normocephalic and atraumatic.  Cardiovascular:     Rate and Rhythm: Normal rate and regular rhythm.     Heart sounds: Normal heart sounds.  Pulmonary:     Breath sounds: Normal breath sounds.  Skin:    General: Skin is warm.  Neurological:     General: No focal deficit present.     Mental Status: He is alert and oriented to person, place, and time.         Assessment And Plan:     1. Class 3 severe obesity  due to excess calories with serious comorbidity and body mass index (BMI) of 50.0 to 59.9 in adult [facility]) Comments: I will increase him to [place] 1mg  once weekly. He has lost 12 pounds since July 2021. He was congratulated on his progress thus far and encouraged to keep up the great work. Advised to aim for at 150 minutes of exercise per week. He will rto in six to eight weeks for re-evaluation.   2. Essential hypertension, benign Comments: Controlled, he will continue with current meds.  3. Need for vaccination Comments: He was given flu vaccine.  - Flu Vaccine QUAD 6+ mos PF IM (Fluarix Quad PF)   Patient was given opportunity to ask questions. Patient verbalized understanding of the plan and was able to repeat key elements of the plan. All questions were answered to their satisfaction.  [name] Greenland, MD   I, [name] Greenland, MD, have reviewed all documentation for this visit. The documentation on 10/01/20 for the exam, diagnosis, procedures, and orders are all accurate and complete.  THE PATIENT IS ENCOURAGED TO PRACTICE SOCIAL DISTANCING DUE TO THE COVID-19 PANDEMIC.

## 2020-09-21 NOTE — Patient Instructions (Signed)

## 2020-10-10 ENCOUNTER — Other Ambulatory Visit: Payer: Self-pay | Admitting: Internal Medicine

## 2020-11-01 ENCOUNTER — Other Ambulatory Visit: Payer: Self-pay | Admitting: Internal Medicine

## 2020-11-22 ENCOUNTER — Encounter: Payer: Self-pay | Admitting: Internal Medicine

## 2020-11-22 ENCOUNTER — Other Ambulatory Visit: Payer: Self-pay

## 2020-11-22 ENCOUNTER — Ambulatory Visit (INDEPENDENT_AMBULATORY_CARE_PROVIDER_SITE_OTHER): Payer: BC Managed Care – PPO | Admitting: Internal Medicine

## 2020-11-22 VITALS — BP 126/84 | HR 83 | Temp 98.1°F | Ht 68.8 in | Wt 381.0 lb

## 2020-11-22 DIAGNOSIS — Z6841 Body Mass Index (BMI) 40.0 and over, adult: Secondary | ICD-10-CM

## 2020-11-22 DIAGNOSIS — Z79899 Other long term (current) drug therapy: Secondary | ICD-10-CM

## 2020-11-22 DIAGNOSIS — I10 Essential (primary) hypertension: Secondary | ICD-10-CM | POA: Diagnosis not present

## 2020-11-22 DIAGNOSIS — Z0000 Encounter for general adult medical examination without abnormal findings: Secondary | ICD-10-CM

## 2020-11-22 DIAGNOSIS — R0683 Snoring: Secondary | ICD-10-CM

## 2020-11-22 DIAGNOSIS — R7309 Other abnormal glucose: Secondary | ICD-10-CM

## 2020-11-22 LAB — POCT URINALYSIS DIPSTICK
Bilirubin, UA: NEGATIVE
Blood, UA: NEGATIVE
Glucose, UA: NEGATIVE
Ketones, UA: NEGATIVE
Leukocytes, UA: NEGATIVE
Nitrite, UA: NEGATIVE
Protein, UA: NEGATIVE
Spec Grav, UA: 1.025 (ref 1.010–1.025)
Urobilinogen, UA: 0.2 E.U./dL
pH, UA: 5.5 (ref 5.0–8.0)

## 2020-11-22 LAB — POCT UA - MICROALBUMIN
Albumin/Creatinine Ratio, Urine, POC: <30
Creatinine, POC: 300 mg/dL
Microalbumin Ur, POC: 30 mg/L

## 2020-11-22 MED ORDER — WEGOVY 1 MG/0.5ML ~~LOC~~ SOAJ: 1.0000 mg | SUBCUTANEOUS | 0 refills | Status: DC

## 2020-11-22 MED FILL — WEGOVY 1 MG/0.5ML SOAJ: 1 | 28 days supply | Qty: 2 | Fill #0

## 2020-11-22 NOTE — Patient Instructions (Signed)

## 2020-11-22 NOTE — Progress Notes (Signed)
I,Katawbba Wiggins,acting as a Education administrator for [name], MD.,have documented all relevant documentation on the behalf of [name], MD,as directed by  [name], MD while in the presence of [name], MD.  This visit occurred during the SARS-CoV-2 public health emergency.  Safety protocols were in place, including screening questions prior to the visit, additional usage of staff PPE, and extensive cleaning of exam room while observing appropriate contact time as indicated for disinfecting solutions.  Subjective:     Patient ID: [name] Gordon , male    DOB: 1976/01/13 , 44 y.o.   MRN: [id number]   Chief Complaint  Patient presents with  . Annual Exam  . Hypertension    HPI  The patient is here today for a physical examination. He has no specific concerns or complaints at this time. He admits he has yet to start a regular exercise regimen.   Hypertension This is a chronic problem. The current episode started more than 1 year ago. The problem is unchanged. The problem is controlled. Pertinent negatives include no anxiety. There are no associated agents to hypertension. Risk factors for coronary artery disease include obesity, sedentary lifestyle and male gender. Past treatments include angiotensin blockers, calcium channel blockers and lifestyle changes. There are no compliance problems.      Past Medical History:  Diagnosis Date  . Fatty liver 2017   Mild  . History of gout   . Hypertension   . Morbid obesity ([place])   . OA (osteoarthritis) of knee   . Spinal stenosis 2018     Family History  Problem Relation Age of Onset  . Hypertension Mother   . Diabetes Mother   . Cancer Father      Current Outpatient Medications:  .  amlodipine-olmesartan (AZOR) 10-20 MG tablet, TAKE 1 TABLET BY MOUTH EVERY DAY, Disp: 30 tablet, Rfl: 5 .  BYSTOLIC 10 MG tablet, TAKE 1 TABLET BY MOUTH EVERY DAY, Disp: 90 tablet, Rfl: 1 .  cholecalciferol (VITAMIN D3) 25 MCG (1000  UT) tablet, Take 1,000 Units by mouth daily., Disp: , Rfl:  .  HYDROcodone-acetaminophen (NORCO/VICODIN) 5-325 MG tablet, Take 1 tablet by mouth every 6 (six) hours as needed for moderate pain., Disp: 30 tablet, Rfl: 0 .  Semaglutide-Weight Management (WEGOVY) 1 MG/0.5ML SOAJ, Inject 1 mg into the skin once a week., Disp: 2 mL, Rfl: 0   No Known Allergies   Men's preventive visit. Patient Health Questionnaire (PHQ-2) is  [place] [facility] Visit from 03/23/2020 in [facility]  PHQ-2 Total Score 0    . Patient is on a regular diet. Marital status: Married. Relevant history for alcohol use is:  Social History   Substance and Sexual Activity  Alcohol Use No  . Relevant history for tobacco use is:  Social History   Tobacco Use  Smoking Status Never Smoker  Smokeless Tobacco Never Used  .   Review of Systems  Constitutional: Positive for fatigue.  HENT: Negative.   Eyes: Negative.   Respiratory: Negative.   Cardiovascular: Negative.   Gastrointestinal: Negative.   Endocrine: Negative.   Genitourinary: Negative.   Musculoskeletal: Negative.   Skin: Negative.   Allergic/Immunologic: Negative.   Neurological: Negative.        He admits to snoring  Hematological: Negative.   Psychiatric/Behavioral: Negative.   All other systems reviewed and are negative.    Today's Vitals   11/22/20 1558  BP: 126/84  Pulse: 83  Temp: 98.1 F (  36.7 C)  TempSrc: Oral  Weight: (!) 381 lb (172.8 kg)  Height: 5' 8.8" (1.748 m)   Body mass index is 56.59 kg/m.  Wt Readings from Last 3 Encounters:  11/22/20 (!) 381 lb (172.8 kg)  09/21/20 (!) 384 lb 6.4 oz (174.4 kg)  08/08/20 (!) 387 lb 3.2 oz (175.6 kg)   Objective:  Physical Exam Vitals and nursing note reviewed.  Constitutional:      Appearance: Normal appearance. He is obese.  HENT:     Head: Normocephalic and atraumatic.     Right Ear: Tympanic membrane, ear canal and external ear normal.     Left Ear:  Tympanic membrane, ear canal and external ear normal.     Nose:     Comments: Deferred, masked    Mouth/Throat:     Comments: Deferred, masked Eyes:     Extraocular Movements: Extraocular movements intact.     Conjunctiva/sclera: Conjunctivae normal.     Pupils: Pupils are equal, round, and reactive to light.  Cardiovascular:     Rate and Rhythm: Normal rate and regular rhythm.     Pulses: Normal pulses.     Heart sounds: Normal heart sounds.  Pulmonary:     Effort: Pulmonary effort is normal.     Breath sounds: Normal breath sounds.  Chest:  Breasts:     Right: Normal. No swelling, bleeding, inverted nipple, mass or nipple discharge.     Left: Normal. No swelling, bleeding, inverted nipple, mass or nipple discharge.    Abdominal:     General: Bowel sounds are normal.     Palpations: Abdomen is soft.     Comments: Obese, soft. Difficult to assess organomegaly.   Genitourinary:    Comments: Deferred, per patient request Musculoskeletal:        General: Normal range of motion.     Cervical back: Normal range of motion and neck supple.  Skin:    General: Skin is warm.  Neurological:     General: No focal deficit present.     Mental Status: He is alert and oriented to person, place, and time.  Psychiatric:        Mood and Affect: Mood normal.        Behavior: Behavior normal.         Assessment And Plan:    1. Health maintenance examination Comments: A full exam was performed. DRE deferred, per patient's request. PATIENT IS ADVISED TO GET 30-45 MINUTES REGULAR EXERCISE NO LESS THAN FOUR TO FIVE DAYS PER WEEK - BOTH WEIGHTBEARING EXERCISES AND AEROBIC ARE RECOMMENDED.  PATIENT IS ADVISED TO FOLLOW A HEALTHY DIET WITH AT LEAST SIX FRUITS/VEGGIES PER DAY, DECREASE INTAKE OF RED MEAT, AND TO INCREASE FISH INTAKE TO TWO DAYS PER WEEK.  MEATS/FISH SHOULD NOT BE FRIED, BAKED OR BROILED IS PREFERABLE.  I SUGGEST WEARING SPF 50 SUNSCREEN ON EXPOSED PARTS AND ESPECIALLY WHEN IN THE  DIRECT SUNLIGHT FOR AN EXTENDED PERIOD OF TIME.  PLEASE AVOID FAST FOOD RESTAURANTS AND INCREASE YOUR WATER INTAKE.  - CMP14+EGFR - CBC - Lipid panel - Hemoglobin A1c - PSA - Hepatitis C antibody  2. Essential hypertension, benign Comments: Chronic, well controlled. He will continue with current meds. EKG performed, NSR w/o acute changes. He is encouraged to follow low sodium diet. He will f/u in six months for re-evaluation.  - POCT Urinalysis Dipstick (81002) - POCT UA - Microalbumin - EKG 12-Lead  3. Other abnormal glucose Comments: His a1c has been elevated in the   past, I will recheck his today. Advised to avoid sugary beverages, including diet drinks.   4. Snoring Comments: He also reports daytime somnolence, fatigue, early am headaches, and nocturia. He agrees to Neuro referral for sleep study evaluation.  - Ambulatory referral to Neurology  5. Class 3 severe obesity due to excess calories with serious comorbidity and body mass index (BMI) of 50.0 to 59.9 in adult (HCC) Comments: BMI 56. Unfortunately, he ran out of Ozempic and did not notify the office. My plan was to switch him to Wegovy. He will resume Ozempic 0.5mg once weekly.  6. Drug therapy He is encouraged to initially strive for BMI less than 50 to decrease cardiac risk. He is advised to exercise no less than 150 minutes per week.   Patient was given opportunity to ask questions. Patient verbalized understanding of the plan and was able to repeat key elements of the plan. All questions were answered to their satisfaction.  Robyn N Sanders, MD   I, Robyn N Sanders, MD, have reviewed all documentation for this visit. The documentation on 11/27/20 for the exam, diagnosis, procedures, and orders are all accurate and complete.  THE PATIENT IS ENCOURAGED TO PRACTICE SOCIAL DISTANCING DUE TO THE COVID-19 PANDEMIC.   

## 2020-11-23 LAB — LIPID PANEL
Chol/HDL Ratio: 3 ratio (ref 0.0–5.0)
Cholesterol, Total: 154 mg/dL (ref 100–199)
HDL: 51 mg/dL (ref >39)
LDL Chol Calc (NIH): 93 mg/dL (ref 0–99)
Triglycerides: 49 mg/dL (ref 0–149)
VLDL Cholesterol Cal: 10 mg/dL (ref 5–40)

## 2020-11-23 LAB — CBC
Hematocrit: 43.8 % (ref 37.5–51.0)
Hemoglobin: 14 g/dL (ref 13.0–17.7)
MCH: 26.8 pg (ref 26.6–33.0)
MCHC: 32 g/dL (ref 31.5–35.7)
MCV: 84 fL (ref 79–97)
Platelets: 317 10*3/uL (ref 150–450)
RBC: 5.23 x10E6/uL (ref 4.14–5.80)
RDW: 13.7 % (ref 11.6–15.4)
WBC: 8.2 10*3/uL (ref 3.4–10.8)

## 2020-11-23 LAB — CMP14+EGFR
ALT: 38 IU/L (ref 0–44)
AST: 18 IU/L (ref 0–40)
Albumin/Globulin Ratio: 1.7 (ref 1.2–2.2)
Albumin: 4.5 g/dL (ref 4.0–5.0)
Alkaline Phosphatase: 84 IU/L (ref 44–121)
BUN/Creatinine Ratio: 10 (ref 9–20)
BUN: 10 mg/dL (ref 6–24)
Bilirubin Total: 0.5 mg/dL (ref 0.0–1.2)
CO2: 24 mmol/L (ref 20–29)
Calcium: 9.4 mg/dL (ref 8.7–10.2)
Chloride: 104 mmol/L (ref 96–106)
Creatinine, Ser: 0.97 mg/dL (ref 0.76–1.27)
GFR calc Af Amer: 108 mL/min/{1.73_m2} (ref >59)
GFR calc non Af Amer: 93 mL/min/{1.73_m2} (ref >59)
Globulin, Total: 2.6 g/dL (ref 1.5–4.5)
Glucose: 116 mg/dL — ABNORMAL HIGH (ref 65–99)
Potassium: 4.6 mmol/L (ref 3.5–5.2)
Sodium: 143 mmol/L (ref 134–144)
Total Protein: 7.1 g/dL (ref 6.0–8.5)

## 2020-11-23 LAB — HEMOGLOBIN A1C
Est. average glucose Bld gHb Est-mCnc: 114 mg/dL
Hgb A1c MFr Bld: 5.6 % (ref 4.8–5.6)

## 2020-11-23 LAB — PSA: Prostate Specific Ag, Serum: 6.2 ng/mL — ABNORMAL HIGH (ref 0.0–4.0)

## 2020-11-23 LAB — HEPATITIS C ANTIBODY: Hep C Virus Ab: <0.1 s/co ratio (ref 0.0–0.9)

## 2020-11-24 ENCOUNTER — Telehealth: Payer: Self-pay

## 2020-11-24 NOTE — Telephone Encounter (Signed)
Left the patient a message to call back for lab results. 

## 2020-11-24 NOTE — Telephone Encounter (Signed)
-----   Message from Glendale Chard, MD sent at 11/23/2020  5:15 PM EST ----- Here are your lab results:  Your kidney function is normal. Your blood count, liver function, blood count and cholesterol look great. You no longer have prediabetes. You are also negative for hepatitis c virus.   Your prostate test is elevated. I would like to refer you to urologist, prostate specialist for further evaluation. Do you agree to this?   Please let me know if you have any questions or concerns. Stay safe!   Sincerely,    Robyn N. Baird [facility], MD

## 2020-11-26 ENCOUNTER — Encounter: Payer: Self-pay | Admitting: Internal Medicine

## 2020-12-08 MED FILL — WEGOVY 1 MG/0.5ML SOAJ: 1 | 28 days supply | Qty: 2 | Fill #0

## 2020-12-20 ENCOUNTER — Other Ambulatory Visit: Payer: Self-pay | Admitting: Internal Medicine

## 2020-12-20 DIAGNOSIS — R972 Elevated prostate specific antigen [PSA]: Secondary | ICD-10-CM

## 2020-12-21 ENCOUNTER — Encounter: Payer: Self-pay | Admitting: Internal Medicine

## 2021-01-01 ENCOUNTER — Institutional Professional Consult (permissible substitution): Payer: BC Managed Care – PPO | Admitting: Neurology

## 2021-01-15 ENCOUNTER — Ambulatory Visit: Payer: BC Managed Care – PPO | Admitting: Internal Medicine

## 2021-02-12 ENCOUNTER — Ambulatory Visit: Payer: BC Managed Care – PPO | Admitting: Neurology

## 2021-02-12 ENCOUNTER — Encounter: Payer: Self-pay | Admitting: Neurology

## 2021-02-12 VITALS — BP 140/83 | HR 81 | Ht 70.0 in | Wt 382.0 lb

## 2021-02-12 DIAGNOSIS — R0683 Snoring: Secondary | ICD-10-CM

## 2021-02-12 DIAGNOSIS — Z79891 Long term (current) use of opiate analgesic: Secondary | ICD-10-CM

## 2021-02-12 DIAGNOSIS — G4719 Other hypersomnia: Secondary | ICD-10-CM

## 2021-02-12 DIAGNOSIS — Z9189 Other specified personal risk factors, not elsewhere classified: Secondary | ICD-10-CM | POA: Diagnosis not present

## 2021-02-12 DIAGNOSIS — G4726 Circadian rhythm sleep disorder, shift work type: Secondary | ICD-10-CM

## 2021-02-12 DIAGNOSIS — E66813 Obesity, class 3: Secondary | ICD-10-CM

## 2021-02-12 DIAGNOSIS — I10 Essential (primary) hypertension: Secondary | ICD-10-CM

## 2021-02-12 DIAGNOSIS — F1520 Other stimulant dependence, uncomplicated: Secondary | ICD-10-CM

## 2021-02-12 DIAGNOSIS — G2581 Restless legs syndrome: Secondary | ICD-10-CM

## 2021-02-12 DIAGNOSIS — F5112 Insufficient sleep syndrome: Secondary | ICD-10-CM

## 2021-02-12 DIAGNOSIS — Z6841 Body Mass Index (BMI) 40.0 and over, adult: Secondary | ICD-10-CM

## 2021-02-12 NOTE — Patient Instructions (Signed)
Restless Legs Syndrome Restless legs syndrome is a condition that causes uncomfortable feelings or sensations in the legs, especially while sitting or lying down. The sensations usually cause an overwhelming urge to move the legs. The arms can also sometimes be affected. The condition can range from mild to severe. The symptoms often interfere with a person's ability to sleep. What are the causes? The cause of this condition is not known. What increases the risk? The following factors may make you more likely to develop this condition:  Being older than 50.  Pregnancy.  Being a woman. In general, the condition is more common in women than in men.  A family history of the condition.  Having iron deficiency.  Overuse of caffeine, nicotine, or alcohol.  Certain medical conditions, such as kidney disease, Parkinson's disease, or nerve damage.  Certain medicines, such as those for high blood pressure, nausea, colds, allergies, depression, and some heart conditions. What are the signs or symptoms? The main symptom of this condition is uncomfortable sensations in the legs, such as:  Pulling.  Tingling.  Prickling.  Throbbing.  Crawling.  Burning. Usually, the sensations:  Affect both sides of the body.  Are worse when you sit or lie down.  Are worse at night. These may wake you up or make it difficult to fall asleep.  Make you have a strong urge to move your legs.  Are temporarily relieved by moving your legs. The arms can also be affected, but this is rare. People who have this condition often have tiredness during the day because of their lack of sleep at night. How is this diagnosed? This condition may be diagnosed based on:  Your symptoms.  Blood tests. In some cases, you may be monitored in a sleep lab by a specialist (a sleep study). This can detect any disruptions in your sleep. How is this treated? This condition is treated by managing the symptoms. This may  include:  Lifestyle changes, such as exercising, using relaxation techniques, and avoiding caffeine, alcohol, or tobacco.  Medicines. Anti-seizure medicines may be tried first. Follow these instructions at home: General instructions  Take over-the-counter and prescription medicines only as told by your health care provider.  Use methods to help relieve the uncomfortable sensations, such as: ? Massaging your legs. ? Walking or stretching. ? Taking a cold or hot bath.  Keep all follow-up visits as told by your health care provider. This is important. Lifestyle  Practice good sleep habits. For example, go to bed and get up at the same time every day. Most adults should get 7-9 hours of sleep each night.  Exercise regularly. Try to get at least 30 minutes of exercise most days of the week.  Practice ways of relaxing, such as yoga or meditation.  Avoid caffeine and alcohol.  Do not use any products that contain nicotine or tobacco, such as cigarettes and e-cigarettes. If you need help quitting, ask your health care provider.      Contact a health care provider if:  Your symptoms get worse or they do not improve with treatment. Summary  Restless legs syndrome is a condition that causes uncomfortable feelings or sensations in the legs, especially while sitting or lying down.  The symptoms often interfere with a person's ability to sleep.  This condition is treated by managing the symptoms. You may need to make lifestyle changes or take medicines. This information is not intended to replace advice given to you by your health care provider. Make   sure you discuss any questions you have with your health care provider. Document Revised: 01/14/2020 Document Reviewed: 12/15/2017 Elsevier Patient Education  2021 [place] for Sleep Apnea  Sleep apnea is a condition in which breathing pauses or becomes shallow during sleep. Sleep apnea screening is a test to determine if  you are at risk for sleep apnea. The test is easy and only takes a few minutes. Your health care provider may ask you to have this test in preparation for surgery or as part of a physical exam. What are the symptoms of sleep apnea? Common symptoms of sleep apnea include:  Snoring.  Restless sleep.  Daytime sleepiness.  Pauses in breathing.  Choking during sleep.  Irritability.  Forgetfulness.  Trouble thinking clearly.  Depression.  Personality changes. Most people with sleep apnea are not aware that they have it. Why should I get screened? Getting screened for sleep apnea can help:  Ensure your safety. It is important for your health care providers to know whether or not you have sleep apnea, especially if you are having surgery or have other long-term (chronic) health conditions.  Improve your health and allow you to get a better night's rest. Restful sleep can help you: ? Have more energy. ? Lose weight. ? Improve high blood pressure. ? Improve diabetes management. ? Prevent stroke. ? Prevent car accidents. How is screening done? Screening usually includes being asked a list of questions about your sleep quality. Some questions you may be asked include:  Do you snore?  Is your sleep restless?  Do you have daytime sleepiness?  Has a partner or spouse told you that you stop breathing during sleep?  Have you had trouble concentrating or memory loss? If your screening test is positive, you are at risk for the condition. Further testing may be needed to confirm a diagnosis of sleep apnea. Where to find more information You can find screening tools online or at your health care clinic. For more information about sleep apnea screening and healthy sleep, visit these websites:  Centers for Disease Control and Prevention: LearningDermatology.pl  American Sleep Apnea Association: www.sleepapnea.org Contact a health care provider if:  You think that you may have  sleep apnea. Summary  Sleep apnea screening can help determine if you are at risk for sleep apnea.  It is important for your health care providers to know whether or not you have sleep apnea, especially if you are having surgery or have other chronic health conditions.  You may be asked to take a screening test for sleep apnea in preparation for surgery or as part of a physical exam. This information is not intended to replace advice given to you by your health care provider. Make sure you discuss any questions you have with your health care provider. Document Revised: 09/11/2018 Document Reviewed: 03/07/2017 Elsevier Patient Education  2021 [place] Sleep Information, Adult Quality sleep is important for your mental and physical health. It also improves your quality of life. Quality sleep means you:  Are asleep for most of the time you are in bed.  Fall asleep within 30 minutes.  Wake up no more than once a night.  Are awake for no longer than 20 minutes if you do wake up during the night. Most adults need 7-8 hours of quality sleep each night. How can poor sleep affect me? If you do not get enough quality sleep, you may have:  Mood swings.  Daytime sleepiness.  Confusion.  Decreased reaction  time.  Sleep disorders, such as insomnia and sleep apnea.  Difficulty with: ? Solving problems. ? Coping with stress. ? Paying attention. These issues may affect your performance and productivity at work, school, and at home. Lack of sleep may also put you at higher risk for accidents, suicide, and risky behaviors. If you do not get quality sleep you may also be at higher risk for several health problems, including:  Infections.  Type 2 diabetes.  Heart disease.  High blood pressure.  Obesity.  Worsening of long-term conditions, like arthritis, kidney disease, depression, Parkinson's disease, and epilepsy. What actions can I take to get more quality  sleep?  Stick to a sleep schedule. Go to sleep and wake up at about the same time each day. Do not try to sleep less on weekdays and make up for lost sleep on weekends. This does not work.  Try to get about 30 minutes of exercise on most days. Do not exercise 2-3 hours before going to bed.  Limit naps during the day to 30 minutes or less.  Do not use any products that contain nicotine or tobacco, such as cigarettes or e-cigarettes. If you need help quitting, ask your health care provider.  Do not drink caffeinated beverages for at least 8 hours before going to bed. Coffee, tea, and some sodas contain caffeine.  Do not drink alcohol close to bedtime.  Do not eat large meals close to bedtime.  Do not take naps in the late afternoon.  Try to get at least 30 minutes of sunlight every day. Morning sunlight is best.  Make time to relax before bed. Reading, listening to music, or taking a hot bath promotes quality sleep.  Make your bedroom a place that promotes quality sleep. Keep your bedroom dark, quiet, and at a comfortable room temperature. Make sure your bed is comfortable. Take out sleep distractions like TV, a computer, smartphone, and bright lights.  If you are lying awake in bed for longer than 20 minutes, get up and do a relaxing activity until you feel sleepy.  Work with your health care provider to treat medical conditions that may affect sleeping, such as: ? Nasal obstruction. ? Snoring. ? Sleep apnea and other sleep disorders.  Talk to your health care provider if you think any of your prescription medicines may cause you to have difficulty falling or staying asleep.  If you have sleep problems, talk with a sleep consultant. If you think you have a sleep disorder, talk with your health care provider about getting evaluated by a specialist.      Where to find more information  [place] website: https://sleepfoundation.org  National Heart, Lung, and [place] ([place]): http://www.saunders.info/.pdf  Centers for Disease Control and Prevention (CDC): LearningDermatology.pl Contact a health care provider if you:  Have trouble getting to sleep or staying asleep.  Often wake up very early in the morning and cannot get back to sleep.  Have daytime sleepiness.  Have daytime sleep attacks of suddenly falling asleep and sudden muscle weakness (narcolepsy).  Have a tingling sensation in your legs with a strong urge to move your legs (restless legs syndrome).  Stop breathing briefly during sleep (sleep apnea).  Think you have a sleep disorder or are taking a medicine that is affecting your quality of sleep. Summary  Most adults need 7-8 hours of quality sleep each night.  Getting enough quality sleep is an important part of health and well-being.  Make your bedroom a  place that promotes quality sleep and avoid things that may cause you to have poor sleep, such as alcohol, caffeine, smoking, and large meals.  Talk to your health care provider if you have trouble falling asleep or staying asleep. This information is not intended to replace advice given to you by your health care provider. Make sure you discuss any questions you have with your health care provider. Document Revised: 03/04/2018 Document Reviewed: 03/04/2018 Elsevier Patient Education  2021 [employer].

## 2021-02-12 NOTE — Progress Notes (Signed)
SLEEP MEDICINE CLINIC    Provider:  Larey Seat, MD  Primary Care Physician:  Glendale Chard, [address] [place] [address]     Referring Provider: Glendale Chard, [address] [place],   [id number]          Chief Complaint according to patient   Patient presents with:    . New Patient (Initial Visit)     Pt alone, rm 11. Presents today to rule out/address OSA a concern. Never had a SS. Admits to snoring and having moments where he wakes up "feeling like I'm dying" admits to waking up trying to catch his breath. States he avg 3-4 hrs of broken sleep at night and has fatigue during the day      HISTORY OF PRESENT ILLNESS:  [name] Gordon is a 45 -year- old African American male patient who is seen upon a referral for a sleep consultation on 02/12/2021 from Dr. Baird Cancer, MD. He has followed her for over a decade and almost as long as she tried to get him evaluated for possible Sleep apnea.  Chief concern according to patient :  I am gasping, snoring and my wife is worried- I snore loudest on my back.   [name] Gordon  has a past medical history of Fatty liver (2017), History of gout, Hypertension, Morbid obesity ([place]), OA (osteoarthritis) of knee, and Spinal stenosis (2018). he has severe knee pain and is constantly hurting at rest , and he described the irresistible urge to move- RLS. He has 1-2 times Nocturia, No ENT surgery, one bad concussion while in high school / sports.   Family medical /sleep history: No other family member on CPAP with OSA, insomnia, sleep walkers.    Social history:  Patient is married, he is working as a Audiological scientist, working on a loading dock, and lives in a household with one 75 year old son.  The patient currently works in day time- 3.30- 5 AM night shift. Pets are not present. Tobacco use- never   ETOH use; beer- never binging, Caffeine intake in form of Coffee( /) Soda( 3 times 16 ounces a day and  sometimes more), Tea ( sweet, lunch and dinner) or energy drinks.Regular exercise in form of nothing.      Sleep habits are as follows: The patient's dinner time is between 10-11 PM- at work.  The patient goes to bed at 8 AM and often struggles to go to sleep-. Bedroom is not dark, but cool, and quiet.   continues to sleep for 3-4 hours, wakes at 1 Pm for another  bathroom break, and that ends the sleep- The preferred sleep position is supine , with the support of 1-2 pillows. On weekends he wakes still at 1-2 AM after only 4-5 hours of sleep- chronically sleep deprived.  Dreams are reportedly infrequent. The patient wakes up spontaneously.  He reports not feeling refreshed or restored in AM, with symptoms such as dry mouth, morning headaches YES-, and residual fatigue.Naps are not taken- he can't leave for a break at work.  he falls asleep whenever not physically active or mentally stimulated.     Review of Systems: Out of a complete 14 system review, the patient complains of only the following symptoms, and all other reviewed systems are negative.: shift work sleep disorder, night shift- sleep deprivation.  Fatigue,  Excessive sleepiness , snoring, fragmented sleep, Insomnia - sleep restriction.    How likely are you to  doze in the following situations: 0 = not likely, 1 = slight chance, 2 = moderate chance, 3 = high chance   Sitting and Reading? Watching Television? Sitting inactive in a public place (theater or meeting)? As a passenger in a car for an hour without a break? Lying down in the afternoon when circumstances permit? Sitting and talking to someone? Sitting quietly after lunch without alcohol? In a car, while stopped for a few minutes in traffic?   Total = 17-20/ 24 points   FSS endorsed at N?A / 63 points.   Social History   Socioeconomic History  . Marital status: Married    Spouse name: Not on file  . Number of children: Not on file  . Years of education: Not on  file  . Highest education level: Not on file  Occupational History  . Not on file  Tobacco Use  . Smoking status: Never Smoker  . Smokeless tobacco: Never Used  Vaping Use  . Vaping Use: Never used  Substance and Sexual Activity  . Alcohol use: No  . Drug use: No  . Sexual activity: Yes    Birth control/protection: None  Other Topics Concern  . Not on file  Social History Narrative  . Not on file   Social Determinants of Health   Financial Resource Strain: Not on file  Food Insecurity: Not on file  Transportation Needs: Not on file  Physical Activity: Not on file  Stress: Not on file  Social Connections: Not on file    Family History  Problem Relation Age of Onset  . Hypertension Mother   . Diabetes Mother   . Cancer Father     Past Medical History:  Diagnosis Date  . Fatty liver 2017   Mild  . History of gout   . Hypertension   . Morbid obesity ([place])   . OA (osteoarthritis) of knee   . Spinal stenosis 2018    Past Surgical History:  Procedure Laterality Date  . ARTHROSCOPY KNEE W/ DRILLING    . COLONOSCOPY WITH PROPOFOL N/A 06/01/2019   Procedure: COLONOSCOPY WITH PROPOFOL;  Surgeon: Carol Ada, MD;  Location: [facility];  Service: Endoscopy;  Laterality: N/A;  . POLYPECTOMY  06/01/2019   Procedure: POLYPECTOMY;  Surgeon: Carol Ada, MD;  Location: [facility]COPY;  Service: Endoscopy;;     Current Outpatient Medications on File Prior to Visit  Medication Sig Dispense Refill  . amlodipine-olmesartan (AZOR) 10-20 MG tablet TAKE 1 TABLET BY MOUTH EVERY DAY 30 tablet 5  . BYSTOLIC 10 MG tablet TAKE 1 TABLET BY MOUTH EVERY DAY 90 tablet 1  . cholecalciferol (VITAMIN D3) 25 MCG (1000 UT) tablet Take 1,000 Units by mouth daily.    Marland Kitchen HYDROcodone-acetaminophen (NORCO/VICODIN) 5-325 MG tablet Take 1 tablet by mouth every 6 (six) hours as needed for moderate pain. 30 tablet 0  . Semaglutide-Weight Management (WEGOVY) 1 MG/0.5ML SOAJ Inject 1 mg into the skin  once a week. (Patient not taking: Reported on 02/12/2021) 2 mL 0   No current facility-administered medications on file prior to visit.    Physical exam:  Today's Vitals   02/12/21 0856  BP: 140/83  Pulse: 81  Weight: (!) 382 lb (173.3 kg)  Height: 5\' 10"  (1.778 m)   Body mass index is 54.81 kg/m.   Wt Readings from Last 3 Encounters:  02/12/21 (!) 382 lb (173.3 kg)  11/22/20 (!) 381 lb (172.8 kg)  09/21/20 (!) 384 lb 6.4 oz (174.4 kg)  Ht Readings from Last 3 Encounters:  02/12/21 5\' 10"  (1.778 m)  11/22/20 5' 8.8" (1.748 m)  09/21/20 5' 7.6" (1.717 m)      General: The patient is awake, alert and appears not in acute distress. The patient is well groomed. Head: Normocephalic, atraumatic. Neck is supple.  Mallampati 3 plus, peaked palate,  neck circumference:22 inches .  Nasal airflow  patent.  Retrognathia is not seen.  Dental status: crowded.  Still has wisdom teeth.  Cardiovascular:  Regular rate and cardiac rhythm by pulse,  without distended neck veins. Respiratory: Lungs are clear to auscultation.  Skin:  With evidence of ankle edema. Non pitting.  Trunk: The patient's posture is erect.   Neurologic exam : The patient is awake and alert, oriented to place and time.   Memory subjective described as intact.  Attention span & concentration ability appears normal.  Speech is fluent,  without  dysarthria, dysphonia or aphasia.  Mood and affect are appropriate.   Cranial nerves: no loss of smell or taste reported  Pupils are equal and briskly reactive to light. Funduscopic exam deferred.  Extraocular movements in vertical and horizontal planes were intact and without nystagmus. No Diplopia. Visual fields by finger perimetry are intact. Hearing was intact to soft voice and finger rubbing.    Facial sensation intact to fine touch.  Facial motor strength is symmetric and tongue and uvula move midline.  Neck ROM : rotation, tilt and flexion extension were normal  for age and shoulder shrug was symmetrical.    Motor exam:  Symmetric bulk, tone and ROM.   Normal tone without cog- wheeling, symmetric grip strength .   Sensory:  fingertips feel numb. Fine touch and vibration were tested and normal.  Proprioception tested in the upper extremities was normal.   Coordination: Rapid alternating movements in the fingers/hands were of normal speed.  The Finger-to-nose maneuver was intact without evidence of ataxia, dysmetria or tremor.   Gait and station: Patient could rise unassisted from a seated position, walked without assistive device.  Stance is of wide base.  Toe and heel walk were deferred.  Deep tendon reflexes: in the  upper and lower extremities are symmetric and intact.  Babinski response was deferred.       After spending a total time of  45 minutes face to face and additional time for physical and neurologic examination, review of laboratory studies,  personal review of imaging studies, reports and results of other testing and review of referral information / records as far as provided in visit, I have established the following assessments:  Patient is chronically sleep deficient due to night shift work and inability to sleep longer than 4 hours on average. 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   1) shift work sleep with sleep deprivation and resulting EDS.  2)  Very high risk for apnea, OSA and CSA due to opiate pain medication for knee arthritis.  3) super obesity- hypoventilation risk is very high 4)  restless leg syndrome    My Plan is to proceed with:  1) either attended [facility] night study or HST, both will allow screening for apnea, but SPLIT will allow treatment in the same step. He is more likely to have obesity hypoventilation with apnea.   2) consider referral for nutrition advise, weight loss.   3) sleep hygiene discussion, cool, quiet and dark  bedroom to sleep-  -  restriction of caffeine, alcohol and resuming Ozempic/ [id number] with Dr Baird Cancer.   4) caffeine reduction.  5) see if RLS is present in form of PLMs and if this is related to apnea.    I would like to thank Glendale Chard, MD and Glendale Chard, [address] [address] [address],  [place] [address] for allowing me to meet with and to take care of this pleasant patient.   In short, [name] Gordon is presenting with EDS, high risk for hypoventilation by super obesiyy and by narcotic pain medication, also sleep deprivation by shift work.  I plan to follow up either personally or through our NP within 2-4 month.    Electronically signed by: Larey Seat, MD 02/12/2021 9:22 AM  [facility] Board certified in [place] of the [employer] of Sleep Medicine. Board certified In Neurology through the [place], Fellow of the [employer] of Neurology. Medical Director of [employer].

## 2021-02-14 ENCOUNTER — Telehealth: Payer: Self-pay

## 2021-02-14 NOTE — Telephone Encounter (Signed)
LVM for pt to call me back to schedule sleep study  

## 2021-02-20 ENCOUNTER — Telehealth: Payer: Self-pay

## 2021-02-20 NOTE — Telephone Encounter (Signed)
LVM for pt to call me back to schedule sleep study  

## 2021-03-05 ENCOUNTER — Telehealth: Payer: Self-pay

## 2021-03-05 NOTE — Telephone Encounter (Signed)
We have attempted to call the patient two times to schedule sleep study.  Patient has been unavailable at the phone numbers we have on file and has not returned our calls. If patient calls back we will schedule them for their sleep study.  

## 2021-03-10 ENCOUNTER — Other Ambulatory Visit (HOSPITAL_COMMUNITY): Payer: Self-pay

## 2021-03-19 ENCOUNTER — Ambulatory Visit: Payer: BC Managed Care – PPO | Admitting: Internal Medicine

## 2021-04-08 ENCOUNTER — Other Ambulatory Visit: Payer: Self-pay | Admitting: Internal Medicine

## 2021-04-16 ENCOUNTER — Ambulatory Visit: Payer: BC Managed Care – PPO | Admitting: Internal Medicine

## 2021-04-16 ENCOUNTER — Encounter: Payer: Self-pay | Admitting: Internal Medicine

## 2021-04-16 ENCOUNTER — Other Ambulatory Visit: Payer: Self-pay

## 2021-04-16 VITALS — BP 132/72 | HR 74 | Temp 98.3°F | Ht 70.0 in | Wt 389.8 lb

## 2021-04-16 DIAGNOSIS — I10 Essential (primary) hypertension: Secondary | ICD-10-CM

## 2021-04-16 DIAGNOSIS — G894 Chronic pain syndrome: Secondary | ICD-10-CM | POA: Diagnosis not present

## 2021-04-16 DIAGNOSIS — E66813 Obesity, class 3: Secondary | ICD-10-CM

## 2021-04-16 DIAGNOSIS — F4321 Adjustment disorder with depressed mood: Secondary | ICD-10-CM

## 2021-04-16 DIAGNOSIS — M170 Bilateral primary osteoarthritis of knee: Secondary | ICD-10-CM | POA: Diagnosis not present

## 2021-04-16 DIAGNOSIS — Z6841 Body Mass Index (BMI) 40.0 and over, adult: Secondary | ICD-10-CM

## 2021-04-16 MED ORDER — HYDROCODONE-ACETAMINOPHEN 5-325 MG PO TABS: 1.0000 | ORAL_TABLET | Freq: Four times a day (QID) | ORAL | 0 refills | Status: DC | PRN

## 2021-04-16 NOTE — Progress Notes (Addendum)
Earleen Newport as a Education administrator for [name], MD.,have documented all relevant documentation on the behalf of [name], MD,as directed by  [name], MD while in the presence of [name], MD. This visit occurred during the SARS-CoV-2 public health emergency.  Safety protocols were in place, including screening questions prior to the visit, additional usage of staff PPE, and extensive cleaning of exam room while observing appropriate contact time as indicated for disinfecting solutions.  Subjective:     Patient ID: [name] Gordon , male    DOB: 1976-02-27 , 45 y.o.   MRN: [id number]   Chief Complaint  Patient presents with  . Hypertension    HPI  Patient is here for blood pressure check, pt did stop taking Wegovy reported on 02/12/2021 due to feeling depressed. He was on the highest dose at the time. He was upset b/c of questionable cause of elevated PSA. He was evaluated by Urology and workup was neg for cancer. He reports he is still trying to get "to himself". He admits that he is quite stressed at work.  c   Past Medical History:  Diagnosis Date  . Fatty liver 2017   Mild  . History of gout   . Hypertension   . Morbid obesity ([place])   . OA (osteoarthritis) of knee   . Spinal stenosis 2018     Family History  Problem Relation Age of Onset  . Hypertension Mother   . Diabetes Mother   . Cancer Father      Current Outpatient Medications:  .  amlodipine-olmesartan (AZOR) 10-20 MG tablet, TAKE 1 TABLET BY MOUTH EVERY DAY, Disp: 30 tablet, Rfl: 5 .  cholecalciferol (VITAMIN D3) 25 MCG (1000 UT) tablet, Take 1,000 Units by mouth daily., Disp: , Rfl:  .  nebivolol (BYSTOLIC) 10 MG tablet, TAKE 1 TABLET BY MOUTH EVERY DAY, Disp: 90 tablet, Rfl: 1 .  HYDROcodone-acetaminophen (NORCO/VICODIN) 5-325 MG tablet, Take 1 tablet by mouth every 6 (six) hours as needed for moderate pain., Disp: 30 tablet, Rfl: 0 .  Semaglutide-Weight Management (WEGOVY) 1  MG/0.5ML SOAJ, Inject 1 mg into the skin once a week. (Patient not taking: No sig reported), Disp: 2 mL, Rfl: 0   No Known Allergies   Review of Systems  Constitutional: Negative.   HENT: Negative.   Eyes: Negative.   Respiratory: Negative.   Cardiovascular: Negative.   Gastrointestinal: Negative.   Genitourinary: Negative.   Musculoskeletal: Positive for arthralgias.  Psychiatric/Behavioral: Positive for dysphoric mood.  c   Today's Vitals   04/16/21 0921  BP: 132/72  Pulse: 74  Temp: 98.3 F (36.8 C)  SpO2: 99%  Weight: (!) 389 lb 12.8 oz (176.8 kg)  Height: _0  (1.778 m)  PainSc: 0-No pain   Body mass index is 55.93 kg/m.   Wt Readings from Last 3 Encounters:  04/16/21 (!) 389 lb 12.8 oz (176.8 kg)  02/12/21 (!) 382 lb (173.3 kg)  11/22/20 (!) 381 lb (172.8 kg)    Objective:  Physical Exam Vitals and nursing note reviewed.  Constitutional:      Appearance: Normal appearance. He is obese.  HENT:     Head: Normocephalic and atraumatic.     Nose:     Comments: Masked     Mouth/Throat:     Comments: Masked  Cardiovascular:     Rate and Rhythm: Normal rate and regular rhythm.     Heart sounds: Normal heart sounds.  Pulmonary:  Effort: Pulmonary effort is normal.     Breath sounds: Normal breath sounds.  Skin:    General: Skin is warm.  Neurological:     General: No focal deficit present.     Mental Status: He is alert.  Psychiatric:        Mood and Affect: Mood normal.         Assessment And Plan:     1. Essential hypertension, benign Comments: Chronic, fair control. He will c/w Azor and Bystolic. Advised to follow low sodium diet. Encouraged to incorporate more exercise into his routine.  I will check renal function today.  - BMP8+EGFR  2. Adjustment disorder with depressed mood Comments: His sx are due to work stressors. He agrees to referral for therapy. He reports most of his sx are due to being looked over for job opportunity.  -  Ambulatory referral to Psychology  3. Primary osteoarthritis of both knees Comments: He has chronic pain, is in need of TKR. Cannot move forward w/ procedure until he loses at least 10% of his BW. He was given refill on Norco as requested. Review of the [place] CSRS was performed in accordance of the [place] prior to dispensing any controlled drugs.  4. Class 3 severe obesity due to excess calories with serious comorbidity and body mass index (BMI) of 50.0 to 59.9 in adult [facility]) Comments: BMI 55. I will resume Ozempic, 0.73m weekly x 4 weeks, then increase to 0.525m He is reminded to stop eating when full to avoid side effects. I plan to [facility] him [id number]. He will f/u in 8 weeks for re-evaluation.   5. Chronic pain syndrome Please see #3.  Patient was given opportunity to ask questions. Patient verbalized understanding of the plan and was able to repeat key elements of the plan. All questions were answered to their satisfaction.   I, RobMaximino GreenlandD, have reviewed all documentation for this visit. The documentation on 04/16/21 for the exam, diagnosis, procedures, and orders are all accurate and complete.   IF YOU HAVE BEEN REFERRED TO A SPECIALIST, IT MAY TAKE 1-2 WEEKS TO SCHEDULE/PROCESS THE REFERRAL. IF YOU HAVE NOT HEARD FROM US/SPECIALIST IN TWO WEEKS, PLEASE GIVE US [place] AT [phone] X [phone].   THE PATIENT IS ENCOURAGED TO PRACTICE SOCIAL DISTANCING DUE TO THE COVID-19 PANDEMIC.

## 2021-04-16 NOTE — Patient Instructions (Signed)
Adjustment Disorder, Adult Adjustment disorder is a group of symptoms that can develop after a stressful life event, such as the loss of a job or a serious physical illness. The symptoms can affect how you feel, think, and act. They may also interfere with your relationships. Adjustment disorder increases your risk of suicide and substance abuse. If adjustment disorder is not managed early, it can make medical conditions that you already have worse. If the stressful life event persists, the disorder may continue and become a persistent form of adjustment disorder. What are the causes? This condition is caused by difficulty recovering from or coping with a stressful life event. What increases the risk? You are more likely to develop this condition if:  You have had previous problems coping with life stressors.  You are being treated for a long-term (chronic) illness.  You are being treated for an illness that cannot be cured (terminal illness).  You have a family history of mental illness. What are the signs or symptoms? Symptoms of this condition include:  Behavioral symptoms such as: ? Trouble doing daily tasks. ? Reckless driving. ? Poor work Systems analyst. ? Ignoring bills. ? Avoiding family and friends. ? Impulsive actions.  Emotional symptoms such as: ? Sadness, depression, or crying spells. ? Worrying a lot, or feeling nervous or anxious. ? Loss of enjoyment. ? Feelings of loss or hopelessness. ? Irritability. ? Thoughts of suicide.  Physical symptoms such as: ? Change in appetite or weight. ? Complaining of feeling sick without being ill. ? Feeling dazed or disconnected. ? Nightmares. ? Trouble sleeping. Symptoms of this condition start within 3 months of the stressful event. They do not last more than 6 months, unless the stressful circumstances last longer. Normal grieving after the death of a loved one is not a symptom of this condition. How is this diagnosed? To  diagnose this condition, your health care provider will ask about what has happened in your life and how it has affected you. He or she may also ask about your medical history and your use of medicines, alcohol, and other substances. Your health care provider may do a physical exam and order lab tests or other studies. You may be referred to a mental health specialist. How is this treated? Treatment options for this condition include:  Counseling or talk therapy. Talk therapy is usually provided by mental health specialists. This therapy may be individual or may involve family members.  Medicines. Certain medicines may help with depression, anxiety, and sleep.  Support groups. These offer emotional support, advice, and guidance. They are made up of people who have had similar experiences.  Observation and time. This is sometimes called watchful waiting. In this treatment, health care providers monitor your health and behavior without other treatment. Adjustment disorder sometimes gets better on its own with time.   Follow these instructions at home:  Take over-the-counter and prescription medicines only as told by your health care provider.  Keep all follow-up visits. This is important.  Contact trusted family and friends for support. Let them know what is going on with you and how they can help. Contact a health care provider if:  Your symptoms do not improve in 6 months.  Your symptoms get worse. Get help right away if:  You have serious thoughts about hurting yourself or someone else. If you ever feel like you may hurt yourself or others, or have thoughts about taking your own life, get help right away. Go to your nearest emergency  department or:  Call your local emergency services (911 in the [place]  Call a suicide crisis helpline, such as the National Suicide Prevention Lifeline at 1-800-273-8255. This is open 24 hours a day in the [place]  Text the Crisis Text Line at 741741 (in  the [place] Summary  Adjustment disorder is a group of symptoms that can develop after a stressful life event, such as the loss of a job or a serious physical illness. The symptoms can affect how you feel, think, and act. They may interfere with your relationships.  Symptoms of this condition start within 3 months of the stressful event. They do not last more than 6 months, unless the stressful circumstances last longer.  Treatment may include talk therapy, medicines, participation in a support group, or observation to see if symptoms improve.  Contact your health care provider if your symptoms get worse or do not improve in 6 months.  If you ever feel like you may hurt yourself or others, or have thoughts about taking your own life, get help right away. This information is not intended to replace advice given to you by your health care provider. Make sure you discuss any questions you have with your health care provider. Document Revised: 04/07/2020 Document Reviewed: 04/07/2020 Elsevier Patient Education  2021 Elsevier Inc.  

## 2021-06-18 ENCOUNTER — Ambulatory Visit: Payer: BC Managed Care – PPO | Admitting: Internal Medicine

## 2021-09-03 ENCOUNTER — Other Ambulatory Visit: Payer: Self-pay | Admitting: Internal Medicine

## 2021-10-26 ENCOUNTER — Ambulatory Visit (INDEPENDENT_AMBULATORY_CARE_PROVIDER_SITE_OTHER): Payer: BC Managed Care – PPO

## 2021-10-26 ENCOUNTER — Other Ambulatory Visit: Payer: Self-pay

## 2021-10-26 ENCOUNTER — Ambulatory Visit (HOSPITAL_COMMUNITY)
Admission: EM | Admit: 2021-10-26 | Discharge: 2021-10-26 | Disposition: A | Discharge status: Home / Self Care | Payer: BC Managed Care – PPO | Attending: Physician Assistant | Admitting: Physician Assistant

## 2021-10-26 ENCOUNTER — Encounter: Payer: Self-pay | Admitting: Internal Medicine

## 2021-10-26 ENCOUNTER — Encounter (HOSPITAL_COMMUNITY): Payer: Self-pay

## 2021-10-26 DIAGNOSIS — R059 Cough, unspecified: Secondary | ICD-10-CM | POA: Diagnosis not present

## 2021-10-26 DIAGNOSIS — J069 Acute upper respiratory infection, unspecified: Secondary | ICD-10-CM | POA: Insufficient documentation

## 2021-10-26 DIAGNOSIS — Z20822 Contact with and (suspected) exposure to covid-19: Secondary | ICD-10-CM | POA: Diagnosis not present

## 2021-10-26 DIAGNOSIS — R000 Tachycardia, unspecified: Secondary | ICD-10-CM | POA: Diagnosis not present

## 2021-10-26 DIAGNOSIS — R0602 Shortness of breath: Secondary | ICD-10-CM

## 2021-10-26 DIAGNOSIS — R051 Acute cough: Secondary | ICD-10-CM | POA: Diagnosis present

## 2021-10-26 DIAGNOSIS — I10 Essential (primary) hypertension: Secondary | ICD-10-CM | POA: Diagnosis not present

## 2021-10-26 DIAGNOSIS — J029 Acute pharyngitis, unspecified: Secondary | ICD-10-CM | POA: Diagnosis not present

## 2021-10-26 LAB — POC INFLUENZA A AND B ANTIGEN (URGENT CARE ONLY)
INFLUENZA A ANTIGEN, POC: NEGATIVE
INFLUENZA B ANTIGEN, POC: NEGATIVE

## 2021-10-26 LAB — POCT RAPID STREP A, ED / UC: Streptococcus, Group A Screen (Direct): NEGATIVE

## 2021-10-26 MED ORDER — LIDOCAINE VISCOUS HCL 2 % MT SOLN: 15.0000 mL | OROMUCOSAL | 0 refills | Status: DC | PRN

## 2021-10-26 MED ORDER — PROMETHAZINE-DM 6.25-15 MG/5ML PO SYRP: 5.0000 mL | ORAL_SOLUTION | Freq: Three times a day (TID) | ORAL | 0 refills | Status: DC | PRN

## 2021-10-26 NOTE — ED Notes (Signed)
Pt unable to give urine

## 2021-10-26 NOTE — ED Notes (Signed)
Pt was unable to give urine. Order discontinued.

## 2021-10-26 NOTE — Discharge Instructions (Addendum)
Your chest x-ray was normal.  You are negative for flu and strep.  We will contact you if your COVID test is positive.  Please use lidocaine in the back of your mouth up to 4 times a day as needed for sore throat symptoms.  Be careful eating and drinking after using this medication as it does increase the risk of choking.  Use Promethazine DM up to 3 times a day as needed for cough.  This can make you sleepy so not drive or drink alcohol taking it.  Use Tylenol, Mucinex, Flonase for additional symptom relief.  Make sure you are resting and drinking plenty of fluid.  Follow-up with your primary care provider first thing next week for reevaluation including blood pressure and heart rate recheck.  If you have any worsening symptoms including feeling your heart racing, shortness of breath, lightheadedness, chest discomfort, feeling like you are to pass out you need to go to the emergency room.

## 2021-10-26 NOTE — ED Provider Notes (Signed)
[place]    CSN: [id number] Arrival date & time: 10/26/21  0815      History   Chief Complaint Chief Complaint  Patient presents with   Sore Throat   Cough    HPI [name] Gordon is a 45 y.o. male.   Patient presents today with a 5 to 6-day history of worsening symptoms.  He reports severe sore throat is interfering with his ability to eat and drink.  He reports associated productive cough, nasal congestion.  Denies any fever, body aches, nausea, vomiting, chest pain, shortness of breath.  He does report some discomfort in his chest with cough denies any ongoing chest pain.  He has tried NyQuil for high blood pressure without improvement of symptoms.  He has had COVID-19 vaccine but has not had COVID in the past.  He has not had flu shot.  He does report several people at work who have recently been ill but does not know what they were diagnosed with.  Denies any recent antibiotics.  Denies history of asthma, COPD, smoking.  Blood pressure was noted to be very elevated.  Patient denies any chest pain, shortness of breath, headache, dizziness.  He does have a history of hypertension and has not yet taken his medication today.  Denies any decongestant use or increased sodium consumption.   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   RLS (restless legs syndrome) 02/12/2021   Essential hypertension,  benign 08/08/2020   Class 3 severe obesity due to excess calories with serious comorbidity and body mass index (BMI) of 50.0 to 59.9 in adult [facility]) 01/04/2020   Onychomycosis 10/25/2019    Past Surgical History:  Procedure Laterality Date   ARTHROSCOPY KNEE W/ DRILLING     COLONOSCOPY WITH PROPOFOL N/A 06/01/2019   Procedure: COLONOSCOPY WITH PROPOFOL;  Surgeon: Carol Ada, MD;  Location: [facility] ENDOSCOPY;  Service: Endoscopy;  Laterality: N/A;   POLYPECTOMY  06/01/2019   Procedure: POLYPECTOMY;  Surgeon: Carol Ada, MD;  Location: [facility] ENDOSCOPY;  Service: Endoscopy;;       Home Medications    Prior to Admission medications   Medication Sig Start Date End Date Taking? Authorizing Provider  lidocaine (XYLOCAINE) 2 % solution Use as directed 15 mLs in the mouth or throat every 4 (four) hours as needed for mouth pain. 10/26/21  Yes Earlene Bjelland K, PA-C  promethazine-dextromethorphan (PROMETHAZINE-DM) 6.25-15 MG/5ML syrup Take 5 mLs by mouth 3 (three) times daily as needed for cough. 10/26/21  Yes Milo Solana, Derry Skill, PA-C  amlodipine-olmesartan (AZOR) 10-20 MG tablet TAKE 1 TABLET BY MOUTH EVERY DAY 09/03/21   Glendale Chard, MD  cholecalciferol (VITAMIN D3) 25 MCG (1000 UT) tablet Take 1,000 Units by mouth daily.    [provider]  HYDROcodone-acetaminophen (NORCO/VICODIN) 5-325 MG tablet Take 1 tablet by mouth every 6 (six) hours as needed for moderate pain.  04/16/21   Glendale Chard, MD  nebivolol (BYSTOLIC) 10 MG tablet TAKE 1 TABLET BY MOUTH EVERY DAY 04/09/21   Glendale Chard, MD  Semaglutide-Weight Management [facility]) 1 MG/0.5ML SOAJ Inject 1 mg into the skin once a week. Patient not taking: No sig reported 11/22/20   Glendale Chard, MD    Family History Family History  Problem Relation Age of Onset   Hypertension Mother    Diabetes Mother    Cancer Father     Social History Social History   Tobacco Use   Smoking status: Never   Smokeless tobacco: Never  Vaping Use    Vaping Use: Never used  Substance Use Topics   Alcohol use: No   Drug use: No     Allergies   Patient has no known allergies.   Review of Systems Review of Systems  Constitutional:  Positive for activity change, appetite change and fatigue. Negative for fever.  HENT:  Positive for congestion and sore throat. Negative for sinus pressure and sneezing.   Respiratory:  Positive for cough. Negative for shortness of breath.   Cardiovascular:  Negative for chest pain.  Gastrointestinal:  Negative for abdominal pain, diarrhea, nausea and vomiting.  Neurological:  Positive for headaches. Negative for dizziness and light-headedness.    Physical Exam Triage Vital Signs ED Triage Vitals  Enc Vitals Group     BP 10/26/21 0830 (!) 170/114     Pulse Rate 10/26/21 0830 (!) 121     Resp 10/26/21 0830 15     Temp 10/26/21 0830 98.6 F (37 C)     Temp Source 10/26/21 0830 Oral     SpO2 10/26/21 0830 96 %     Weight --      Height --      Head Circumference --      Peak Flow --      Pain Score 10/26/21 0829 5     Pain Loc --      Pain Edu? --      Excl. in [place]? --    No data found.  Updated Vital Signs BP (!) 174/95 (BP Location: Right Arm)   Pulse 98   Temp 98.6 F (37 C) (Oral)   Resp 15   SpO2 96%   Visual Acuity Right Eye Distance:   Left Eye Distance:   Bilateral Distance:    Right Eye Near:   Left Eye Near:    Bilateral Near:     Physical Exam Vitals reviewed.  Constitutional:      General: He is awake.     Appearance: Normal appearance. He is well-developed. He is not ill-appearing.     Comments: Very pleasant male appears stated age in no acute distress sitting comfortably in exam room  HENT:     Head: Normocephalic and atraumatic.     Right Ear: Tympanic membrane, ear canal and external ear normal. Tympanic membrane is not erythematous or bulging.     Left Ear: Tympanic membrane, ear canal and external ear normal. Tympanic membrane is not erythematous or  bulging.     Nose: Nose normal.     Mouth/Throat:     Pharynx: Uvula midline. Posterior oropharyngeal erythema present. No oropharyngeal exudate.  Cardiovascular:     Rate and Rhythm: Regular rhythm. Tachycardia present.     Heart sounds: Normal heart sounds, S1 normal and S2 normal. No murmur heard. Pulmonary:     Effort: Pulmonary effort is normal. No accessory muscle usage or respiratory distress.  Breath sounds: Normal breath sounds. No stridor. No wheezing, rhonchi or rales.     Comments: Clear to auscultation bilaterally Abdominal:     General: Bowel sounds are normal.     Palpations: Abdomen is soft.     Tenderness: There is no abdominal tenderness.  Neurological:     Mental Status: He is alert.  Psychiatric:        Behavior: Behavior is cooperative.     UC Treatments / Results  Labs (all labs ordered are listed, but only abnormal results are displayed) Labs Reviewed  CULTURE, GROUP A STREP ([place])  SARS CORONAVIRUS 2 (TAT 6-24 HRS)  POCT RAPID STREP A, ED / UC  POC INFLUENZA A AND B ANTIGEN (URGENT CARE ONLY)    EKG   Radiology DG Chest 2 View  Result Date: 10/26/2021 CLINICAL DATA:  Cough, shortness of breath. EXAM: CHEST - 2 VIEW COMPARISON:  February 06, 2011. FINDINGS: The heart size and mediastinal contours are within normal limits. Both lungs are clear. The visualized skeletal structures are unremarkable. IMPRESSION: No active cardiopulmonary disease. Electronically Signed   By: Marijo Conception M.D.   On: 10/26/2021 09:09    Procedures Procedures (including critical care time)  Medications Ordered in UC Medications - No data to display  Initial Impression / Assessment and Plan / UC Course  I have reviewed the triage vital signs and the nursing notes.  Pertinent labs & imaging results that were available during my care of the patient were reviewed by me and considered in my medical decision making (see chart for details).     No evidence of acute  infection on physical exam that warrant initiation of antibiotics.  Concern given tachycardia noted on intake but all testing including flu, strep, chest x-ray were normal.  EKG was obtained that showed sinus tachycardia with ventricular rate of 109 bpm without ischemic changes; compared to 11/08/2019 tracing sinus tachycardia has replaced normal sinus rhythm and there is a late R wave progression.  Attempted to get urine from patient received dehydration is contributing to symptoms but he was unable to give [place] a urine specimen as needed urinated just prior to her visit.  Discussed potential utility of lab work but will defer this for the time being.  Patient was given viscous lidocaine to help manage sore throat with instruction to push fluids but we discussed that he should not eat or drink immediately after use of medication due to increased risk of choking.  He was given Promethazine DM for cough with instruction of drive drink alcohol while taking this.  He can use Mucinex, Flonase, Tylenol for additional symptom relief.  COVID testing was obtained-results pending.  We will contact him if this is positive.  He was provided work excuse note with current CDC return to work guidelines based on COVID test result.  Recommended that he follow-up with his PCP first thing next week if he is unable to see them he should return to our clinic for further evaluation.  Discussed at length alarm symptoms that warrant emergent evaluation.  Strict return precautions given to which he expressed understanding.  Blood pressure was noted to be elevated today.  Patient denies any signs/symptoms of endorgan damage.  Reports that he has not taken his blood pressure medication yet today.  He was instructed to go home and take this medication as soon as possible.  He is to monitor his blood pressure at home and if this remains above 140/90 he needs to  be reevaluated.  He is to avoid decongestants, caffeine, sodium, NSAIDs.  Discussed  that if he develops any chest pain, shortness of breath, leg swelling, heart racing, headache, vision changes in the setting of high blood pressure he needs to go to the emergency room.  Blood pressure needs to be rechecked by either our clinic or primary care first thing next week.  Final Clinical Impressions(s) / UC Diagnoses   Final diagnoses:  Upper respiratory tract infection, unspecified type  Acute cough  Sore throat  Elevated blood pressure reading with diagnosis of hypertension  Sinus tachycardia     Discharge Instructions      Your chest x-ray was normal.  You are negative for flu and strep.  We will contact you if your COVID test is positive.  Please use lidocaine in the back of your mouth up to 4 times a day as needed for sore throat symptoms.  Be careful eating and drinking after using this medication as it does increase the risk of choking.  Use Promethazine DM up to 3 times a day as needed for cough.  This can make you sleepy so not drive or drink alcohol taking it.  Use Tylenol, Mucinex, Flonase for additional symptom relief.  Make sure you are resting and drinking plenty of fluid.  Follow-up with your primary care provider first thing next week for reevaluation including blood pressure and heart rate recheck.  If you have any worsening symptoms including feeling your heart racing, shortness of breath, lightheadedness, chest discomfort, feeling like you are to pass out you need to go to the emergency room.     ED Prescriptions     Medication Sig Dispense Auth. Provider   lidocaine (XYLOCAINE) 2 % solution Use as directed 15 mLs in the mouth or throat every 4 (four) hours as needed for mouth pain. 100 mL Marveline Profeta K, PA-C   promethazine-dextromethorphan (PROMETHAZINE-DM) 6.25-15 MG/5ML syrup Take 5 mLs by mouth 3 (three) times daily as needed for cough. 118 mL Taevyn Hausen K, PA-C      PDMP not reviewed this encounter.   Terrilee Croak, PA-C 10/26/21 1043

## 2021-10-26 NOTE — ED Triage Notes (Signed)
Pt presents with c/o a cough, nasal congestion, sore throat x 5 days. Pt states he has gotten worse by the day. Pt states his head hurts and c/o chest pain when coughing.

## 2021-10-27 LAB — SARS CORONAVIRUS 2 (TAT 6-24 HRS): SARS Coronavirus 2: NEGATIVE

## 2021-10-28 LAB — CULTURE, GROUP A STREP (THRC)

## 2021-10-29 ENCOUNTER — Other Ambulatory Visit: Payer: Self-pay

## 2021-10-30 ENCOUNTER — Other Ambulatory Visit: Payer: Self-pay

## 2021-10-30 ENCOUNTER — Encounter: Payer: Self-pay | Admitting: Nurse Practitioner

## 2021-10-30 ENCOUNTER — Ambulatory Visit: Payer: BC Managed Care – PPO | Admitting: Nurse Practitioner

## 2021-10-30 ENCOUNTER — Ambulatory Visit: Payer: BC Managed Care – PPO | Admitting: Internal Medicine

## 2021-10-30 VITALS — Temp 100.7°F

## 2021-10-30 DIAGNOSIS — R509 Fever, unspecified: Secondary | ICD-10-CM

## 2021-10-30 DIAGNOSIS — J0100 Acute maxillary sinusitis, unspecified: Secondary | ICD-10-CM | POA: Diagnosis not present

## 2021-10-30 DIAGNOSIS — R0981 Nasal congestion: Secondary | ICD-10-CM

## 2021-10-30 DIAGNOSIS — R059 Cough, unspecified: Secondary | ICD-10-CM | POA: Diagnosis not present

## 2021-10-30 DIAGNOSIS — M10071 Idiopathic gout, right ankle and foot: Secondary | ICD-10-CM

## 2021-10-30 LAB — POC COVID19 BINAXNOW: SARS Coronavirus 2 Ag: NEGATIVE

## 2021-10-30 LAB — POC INFLUENZA A&B (BINAX/QUICKVUE)
Influenza A, POC: NEGATIVE
Influenza B, POC: NEGATIVE

## 2021-10-30 MED ORDER — HYDROCODONE BIT-HOMATROP MBR 5-1.5 MG/5ML PO SOLN: 5.0000 mL | Freq: Four times a day (QID) | ORAL | 0 refills | Status: DC | PRN

## 2021-10-30 MED ORDER — BENZONATATE 100 MG PO CAPS: 100.0000 mg | ORAL_CAPSULE | Freq: Three times a day (TID) | ORAL | 1 refills | Status: DC | PRN

## 2021-10-30 MED ORDER — AMOXICILLIN-POT CLAVULANATE 875-125 MG PO TABS: 1.0000 | ORAL_TABLET | Freq: Two times a day (BID) | ORAL | 0 refills | Status: AC

## 2021-10-30 MED ORDER — COLCHICINE 0.6 MG PO TABS: ORAL_TABLET | ORAL | 0 refills | Status: DC

## 2021-10-30 MED ORDER — PREDNISONE 10 MG (21) PO TBPK: ORAL_TABLET | ORAL | 0 refills | Status: DC

## 2021-10-30 NOTE — Progress Notes (Signed)
I,Jameka J Llittleton,acting as a Education administrator for [employer], NP.,have documented all relevant documentation on the behalf of [employer], NP,as directed by  Bary Castilla, NP while in the presence of Bary Castilla, NP.  This visit occurred during the SARS-CoV-2 public health emergency.  Safety protocols were in place, including screening questions prior to the visit, additional usage of staff PPE, and extensive cleaning of exam room while observing appropriate contact time as indicated for disinfecting solutions.  Subjective:     Patient ID: [name] Gordon , male    DOB: 1976-07-08 , 45 y.o.   MRN: [id number]   Chief Complaint  Patient presents with   URI    HPI  Patient also stated he is having a gout flare. He is also here for a sinus infection. He has been feeling very sick for more than 1 week ago. He is congested. His sinuses hurt. He has chills and body ache. He is not vaccinated with flu vaccine but is vaccinated against the covid. He also has gout flare up in his right toe. He is also coughing and feels stuffy. He recently went the urgent care where they gave him some cough medicine which he states does not help at all. He ha a headache and cough.   URI  This is a new problem. The current episode started 1 to 4 weeks ago. The problem has been unchanged. The maximum temperature recorded prior to his arrival was 100.4 - 100.9 F. Associated symptoms include congestion, headaches, joint pain, rhinorrhea, sinus pain and a sore throat. Pertinent negatives include no chest pain, diarrhea or wheezing. He has tried decongestant for the symptoms. The treatment provided no relief.    Past Medical History:  Diagnosis Date   Fatty liver 2017   Mild   History of gout    Hypertension    Morbid obesity (HCC)    OA (osteoarthritis) of knee    Spinal stenosis 2018     Family History  Problem Relation Age of Onset   Hypertension Mother    Diabetes Mother    Cancer Father       Current Outpatient Medications:    amlodipine-olmesartan (AZOR) 10-20 MG tablet, TAKE 1 TABLET BY MOUTH EVERY DAY, Disp: 90 tablet, Rfl: 1   amoxicillin-clavulanate (AUGMENTIN) 875-125 MG tablet, Take 1 tablet by mouth 2 (two) times daily for 7 days., Disp: 14 tablet, Rfl: 0   benzonatate (TESSALON PERLES) 100 MG capsule, Take 1 capsule (100 mg total) by mouth 3 (three) times daily as needed for cough., Disp: 30 capsule, Rfl: 1   cholecalciferol (VITAMIN D3) 25 MCG (1000 UT) tablet, Take 1,000 Units by mouth daily., Disp: , Rfl:    colchicine 0.6 MG tablet, Take 2 tablets by mouth at the onset of gout pain and then 1 tablet in 1 hour. Do not take more than 3 tablets per gout flare up., Disp: 30 tablet, Rfl: 0   HYDROcodone bit-homatropine (HYCODAN) 5-1.5 MG/5ML syrup, Take 5 mLs by mouth every 6 (six) hours as needed for cough., Disp: 120 mL, Rfl: 0   lidocaine (XYLOCAINE) 2 % solution, Use as directed 15 mLs in the mouth or throat every 4 (four) hours as needed for mouth pain., Disp: 100 mL, Rfl: 0   nebivolol (BYSTOLIC) 10 MG tablet, TAKE 1 TABLET BY MOUTH EVERY DAY, Disp: 90 tablet, Rfl: 1   predniSONE (STERAPRED UNI-PAK 21 TAB) 10 MG (21) TBPK tablet, Use as directed., Disp: 21 tablet, Rfl: 0   Semaglutide-Weight  Management (WEGOVY) 1 MG/0.5ML SOAJ, Inject 1 mg into the skin once a week. (Patient not taking: Reported on 10/30/2021), Disp: 2 mL, Rfl: 0   No Known Allergies   Review of Systems  Constitutional:  Positive for chills and fever.  HENT:  Positive for congestion, rhinorrhea, sinus pain and sore throat.   Respiratory:  Negative for shortness of breath and wheezing.   Cardiovascular:  Negative for chest pain and palpitations.  Gastrointestinal:  Negative for constipation and diarrhea.  Endocrine: Negative for polydipsia, polyphagia and polyuria.  Musculoskeletal:  Positive for joint pain and joint swelling.       Gout flare up in right foot/toe   Neurological:  Positive for  headaches.    Today's Vitals   10/30/21 0959  Temp: (!) 100.7 F (38.2 C)   There is no height or weight on file to calculate BMI.   Objective:  Physical Exam Constitutional:      Appearance: Normal appearance. He is obese.  HENT:     Head: Atraumatic.  Cardiovascular:     Rate and Rhythm: Normal rate and regular rhythm.     Pulses: Normal pulses.     Heart sounds: Normal heart sounds. No murmur heard. Pulmonary:     Effort: Pulmonary effort is normal. No respiratory distress.     Breath sounds: No wheezing.  Skin:    General: Skin is warm and dry.     Capillary Refill: Capillary refill takes less than 2 seconds.  Neurological:     Mental Status: He is alert.        Assessment And Plan:     1. Fever, unspecified fever cause - POC Influenza A&B(BINAX/QUICKVUE) - POC COVID-19 - Novel Coronavirus, NAA ([facility])  2. Acute maxillary sinusitis, recurrence not specified - amoxicillin-clavulanate (AUGMENTIN) 875-125 MG tablet; Take 1 tablet by mouth 2 (two) times daily for 7 days.  Dispense: 14 tablet; Refill: 0  3. Sinus congestion - amoxicillin-clavulanate (AUGMENTIN) 875-125 MG tablet; Take 1 tablet by mouth 2 (two) times daily for 7 days.  Dispense: 14 tablet; Refill: 0  4. Cough, unspecified type -Advised patient to not take the promethazine cough syrup that he was given in the urgent care.  - HYDROcodone bit-homatropine (HYCODAN) 5-1.5 MG/5ML syrup; Take 5 mLs by mouth every 6 (six) hours as needed for cough.  Dispense: 120 mL; Refill: 0 - benzonatate (TESSALON PERLES) 100 MG capsule; Take 1 capsule (100 mg total) by mouth 3 (three) times daily as needed for cough.  Dispense: 30 capsule; Refill: 1  5. Acute idiopathic gout of right foot - colchicine 0.6 MG tablet; Take 2 tablets by mouth at the onset of gout pain and then 1 tablet in 1 hour. Do not take more than 3 tablets per gout flare up.  Dispense: 30 tablet; Refill: 0 - predniSONE (STERAPRED UNI-PAK 21 TAB) 10 MG  (21) TBPK tablet; Use as directed.  Dispense: 21 tablet; Refill: 0  -Pt. Will come back for labs in dec. With Dr. Baird Cancer.   I have advised the patient if his symptoms do not get better or do not improve to go to the urgent care or emergency room.   Follow up: if symptoms persist or do not get better.   The patient was encouraged to call or send a message through [place] for any questions or concerns.   Side effects and appropriate use of all the medication(s) were discussed with the patient today. Patient advised to use the medication(s) as directed by  their healthcare provider. The patient was encouraged to read, review, and understand all associated package inserts and contact our office with any questions or concerns. The patient accepts the risks of the treatment plan and had an opportunity to ask questions.   Advised patient to take Vitamin C, D, Zinc.  Keep yourself hydrated with a lot of water and rest. Take Delsym for cough and Mucinex as need. Take Tylenol or pain reliever every 4-6 hours as needed for pain/fever/body ache.  Educated patient if symptoms get worse or if she experiences any SOB, chest pain or pain in her legs to seek immediate emergency care. Continue to monitor your oxygen levels. Call us if you have any questions.  Wear a mask around other people.   Patient was given opportunity to ask questions. Patient verbalized understanding of the plan and was able to repeat key elements of the plan. All questions were answered to their satisfaction.  Raman Narda Fundora, DNP   I, Raman Shanvi Moyd have reviewed all documentation for this visit. The documentation on 10/30/21 for the exam, diagnosis, procedures, and orders are all accurate and complete.   IF YOU HAVE BEEN REFERRED TO A SPECIALIST, IT MAY TAKE 1-2 WEEKS TO SCHEDULE/PROCESS THE REFERRAL. IF YOU HAVE NOT HEARD FROM US/SPECIALIST IN TWO WEEKS, PLEASE GIVE [place] A CALL AT [phone].   THE PATIENT IS ENCOURAGED TO PRACTICE  SOCIAL DISTANCING DUE TO THE COVID-19 PANDEMIC.

## 2021-10-31 LAB — SARS-COV-2, NAA 2 DAY TAT

## 2021-10-31 LAB — NOVEL CORONAVIRUS, NAA: SARS-CoV-2, NAA: NOT DETECTED

## 2021-11-01 ENCOUNTER — Other Ambulatory Visit: Payer: Self-pay | Admitting: Internal Medicine

## 2021-11-20 ENCOUNTER — Encounter: Payer: BC Managed Care – PPO | Admitting: Internal Medicine

## 2021-11-20 NOTE — Progress Notes (Deleted)
[name] Gordon,acting as a Education administrator for [name] Greenland, MD.,have documented all relevant documentation on the behalf of [name] Greenland, MD,as directed by  [name] Greenland, MD while in the presence of [name] Greenland, MD.  This visit occurred during the SARS-CoV-2 public health emergency.  Safety protocols were in place, including screening questions prior to the visit, additional usage of staff PPE, and extensive cleaning of exam room while observing appropriate contact time as indicated for disinfecting solutions.  Subjective:     Patient ID: [name] Gordon , male    DOB: May 26, 1976 , 45 y.o.   MRN: [id number]   No chief complaint on file.   HPI  The patient is here today for a physical examination. He has no specific concerns or complaints at this time.   Hypertension This is a chronic problem. The current episode started more than 1 year ago. The problem is unchanged. The problem is controlled. Pertinent negatives include no anxiety. There are no associated agents to hypertension. Risk factors for coronary artery disease include obesity, sedentary lifestyle and male gender. Past treatments include angiotensin blockers, calcium channel blockers and lifestyle changes. There are no compliance problems.     Past Medical History:  Diagnosis Date   Fatty liver 2017   Mild   History of gout    Hypertension    Morbid obesity (HCC)    OA (osteoarthritis) of knee    Spinal stenosis 2018     Family History  Problem Relation Age of Onset   Hypertension Mother    Diabetes Mother    Cancer Father      Current Outpatient Medications:    amlodipine-olmesartan (AZOR) 10-20 MG tablet, TAKE 1 TABLET BY MOUTH EVERY DAY, Disp: 90 tablet, Rfl: 1   benzonatate (TESSALON PERLES) 100 MG capsule, Take 1 capsule (100 mg total) by mouth 3 (three) times daily as needed for cough., Disp: 30 capsule, Rfl: 1   cholecalciferol (VITAMIN D3) 25 MCG (1000 UT) tablet, Take 1,000 Units by mouth daily.,  Disp: , Rfl:    colchicine 0.6 MG tablet, Take 2 tablets by mouth at the onset of gout pain and then 1 tablet in 1 hour. Do not take more than 3 tablets per gout flare up., Disp: 30 tablet, Rfl: 0   HYDROcodone bit-homatropine (HYCODAN) 5-1.5 MG/5ML syrup, Take 5 mLs by mouth every 6 (six) hours as needed for cough., Disp: 120 mL, Rfl: 0   lidocaine (XYLOCAINE) 2 % solution, Use as directed 15 mLs in the mouth or throat every 4 (four) hours as needed for mouth pain., Disp: 100 mL, Rfl: 0   nebivolol (BYSTOLIC) 10 MG tablet, TAKE 1 TABLET BY MOUTH EVERY DAY, Disp: 90 tablet, Rfl: 1   predniSONE (STERAPRED UNI-PAK 21 TAB) 10 MG (21) TBPK tablet, Use as directed., Disp: 21 tablet, Rfl: 0   Semaglutide-Weight Management (WEGOVY) 1 MG/0.5ML SOAJ, Inject 1 mg into the skin once a week. (Patient not taking: Reported on 10/30/2021), Disp: 2 mL, Rfl: 0   No Known Allergies   Men's preventive visit. Patient Health Questionnaire (PHQ-2) is  [place] Office Visit from 04/16/2021 in [facility]  PHQ-2 Total Score 3     . Patient is on a *** diet. Marital status: Married. Relevant history for alcohol use is:  Social History   Substance and Sexual Activity  Alcohol Use No  . Relevant history for tobacco use is:  Social History   Tobacco Use  Smoking Status  Never  Smokeless Tobacco Never  .   Review of Systems  Constitutional: Negative.   HENT: Negative.    Eyes: Negative.   Respiratory: Negative.    Cardiovascular: Negative.   Gastrointestinal: Negative.   Endocrine: Negative.   Genitourinary: Negative.   Musculoskeletal: Negative.   Skin: Negative.   Allergic/Immunologic: Negative.   Neurological: Negative.   Hematological: Negative.   Psychiatric/Behavioral: Negative.      There were no vitals filed for this visit. There is no height or weight on file to calculate BMI.   Objective:  Physical Exam      Assessment And Plan:    1. Encounter for general  adult medical examination w/o abnormal findings  2. Essential hypertension, benign  3. Other abnormal glucose     Patient was given opportunity to ask questions. Patient verbalized understanding of the plan and was able to repeat key elements of the plan. All questions were answered to their satisfaction.   [name] Gordon, CMA   I, [name] Gordon, CMA, have reviewed all documentation for this visit. The documentation on 11/20/21 for the exam, diagnosis, procedures, and orders are all accurate and complete.  THE PATIENT IS ENCOURAGED TO PRACTICE SOCIAL DISTANCING DUE TO THE COVID-19 PANDEMIC.

## 2021-11-21 NOTE — Progress Notes (Signed)
erroneous

## 2021-11-27 ENCOUNTER — Encounter: Payer: BC Managed Care – PPO | Admitting: Internal Medicine

## 2022-04-01 ENCOUNTER — Ambulatory Visit: Payer: BC Managed Care – PPO | Admitting: Internal Medicine

## 2022-04-01 ENCOUNTER — Encounter: Payer: Self-pay | Admitting: Internal Medicine

## 2022-04-01 VITALS — BP 138/98 | HR 104 | Temp 99.1°F | Ht 67.4 in | Wt 382.8 lb

## 2022-04-01 DIAGNOSIS — J020 Streptococcal pharyngitis: Secondary | ICD-10-CM | POA: Insufficient documentation

## 2022-04-01 DIAGNOSIS — J039 Acute tonsillitis, unspecified: Secondary | ICD-10-CM | POA: Insufficient documentation

## 2022-04-01 DIAGNOSIS — I10 Essential (primary) hypertension: Secondary | ICD-10-CM | POA: Diagnosis not present

## 2022-04-01 LAB — POCT RAPID STREP A (OFFICE): Rapid Strep A Screen: POSITIVE — AB

## 2022-04-01 MED ORDER — AMOXICILLIN 500 MG PO CAPS: 500.0000 mg | ORAL_CAPSULE | Freq: Two times a day (BID) | ORAL | 0 refills | Status: DC

## 2022-04-01 MED ORDER — CEFTRIAXONE SODIUM 500 MG IJ SOLR
500.0000 mg | Freq: Once | INTRAMUSCULAR | Status: AC
  Administered 2022-04-01: 500 mg via INTRAMUSCULAR

## 2022-04-01 NOTE — Progress Notes (Signed)
?[name],acting as a Education administrator for [name] Greenland, MD.,have documented all relevant documentation on the behalf of [name] Greenland, MD,as directed by  [name] Greenland, MD while in the presence of [name] Greenland, MD.  ?This visit occurred during the SARS-CoV-2 public health emergency.  Safety protocols were in place, including screening questions prior to the visit, additional usage of staff PPE, and extensive cleaning of exam room while observing appropriate contact time as indicated for disinfecting solutions. ? ?Subjective:  ?  ? Patient ID: [name] Gordon , male    DOB: [birth year] , 46 y.o.   MRN: [id number] ? ? ?Chief Complaint  ?Patient presents with  ? URI  ? ? ?HPI ? ?He presents today for further evaluation of a sore throat. He states he had a tickle in his throat on Saturday, but his sx progressed on Sunday while helping his wife move around some file cabinets at her job. He denies fever/chills. There is pain with swallowing. Denies having any ill contacts. Admits he doesn't get a lot of sleep, he works 2nd shift and then does a lot of running around with his son during the day. He has tried Nyquil with minimal relief of his sx.  ? ?Sore Throat  ?This is a new problem. The current episode started in the past 7 days. The problem has been gradually worsening. Neither side of throat is experiencing more pain than the other. There has been no fever. The fever has been present for 3 to 4 days. The pain is at a severity of 7/10. The pain is moderate. Associated symptoms include congestion and a hoarse voice. Pertinent negatives include no coughing or shortness of breath. The treatment provided no relief.   ? ?Past Medical History:  ?Diagnosis Date  ? Fatty liver 2017  ? Mild  ? History of gout   ? Hypertension   ? Morbid obesity ([place])   ? OA (osteoarthritis) of knee   ? Spinal stenosis 2018  ?  ? ?Family History  ?Problem Relation Age of Onset  ? Hypertension Mother   ? Diabetes Mother   ? Cancer  Father   ? ? ? ?Current Outpatient Medications:  ?  amlodipine-olmesartan (AZOR) 10-20 MG tablet, TAKE 1 TABLET BY MOUTH EVERY DAY, Disp: 90 tablet, Rfl: 1 ?  amoxicillin (AMOXIL) 500 MG capsule, Take 1 capsule (500 mg total) by mouth 2 (two) times daily., Disp: 20 capsule, Rfl: 0 ?  benzonatate (TESSALON PERLES) 100 MG capsule, Take 1 capsule (100 mg total) by mouth 3 (three) times daily as needed for cough., Disp: 30 capsule, Rfl: 1 ?  cholecalciferol (VITAMIN D3) 25 MCG (1000 UT) tablet, Take 1,000 Units by mouth daily., Disp: , Rfl:  ?  colchicine 0.6 MG tablet, Take 2 tablets by mouth at the onset of gout pain and then 1 tablet in 1 hour. Do not take more than 3 tablets per gout flare up., Disp: 30 tablet, Rfl: 0 ?  nebivolol (BYSTOLIC) 10 MG tablet, TAKE 1 TABLET BY MOUTH EVERY DAY, Disp: 90 tablet, Rfl: 1  ? ?No Known Allergies  ? ?Review of Systems  ?Constitutional:  Positive for fatigue.  ?HENT:  Positive for congestion and hoarse voice.   ?Respiratory: Negative.  Negative for cough and shortness of breath.   ?Cardiovascular: Negative.   ?Gastrointestinal: Negative.   ?Neurological: Negative.   ?Psychiatric/Behavioral: Negative.     ? ?Today's Vitals  ? 04/01/22 1216  ?BP: (!) 138/98  ?Pulse: (!) 104  ?  Temp: 99.1 ?F (37.3 ?C)  ?Weight: (!) 382 lb 12.8 oz (173.6 kg)  ?Height: 5' 7.4" (1.712 m)  ?PainSc: 0-No pain  ? ?Body mass index is 59.25 kg/m?.  ?Wt Readings from Last 3 Encounters:  ?04/01/22 (!) 382 lb 12.8 oz (173.6 kg)  ?04/16/21 (!) 389 lb 12.8 oz (176.8 kg)  ?02/12/21 (!) 382 lb (173.3 kg)  ?  ? ?Objective:  ?Physical Exam ?Vitals and nursing note reviewed.  ?Constitutional:   ?   Appearance: Normal appearance. He is obese.  ?HENT:  ?   Head: Normocephalic and atraumatic.  ?   Mouth/Throat:  ?   Mouth: Mucous membranes are moist.  ?   Pharynx: Uvula midline. Pharyngeal swelling and posterior oropharyngeal erythema present. No oropharyngeal exudate or uvula swelling.  ?   Tonsils: No tonsillar  exudate. 1+ on the right. 1+ on the left.  ?Eyes:  ?   Extraocular Movements: Extraocular movements intact.  ?Cardiovascular:  ?   Rate and Rhythm: Normal rate and regular rhythm.  ?   Heart sounds: Normal heart sounds.  ?Pulmonary:  ?   Effort: Pulmonary effort is normal.  ?   Breath sounds: Normal breath sounds.  ?Musculoskeletal:  ?   Cervical back: Normal range of motion.  ?Skin: ?   General: Skin is warm.  ?Neurological:  ?   General: No focal deficit present.  ?   Mental Status: He is alert.  ?Psychiatric:     ?   Mood and Affect: Mood normal.  ?   ?Assessment And Plan:  ?   ?1. Tonsillitis ?Comments: Strep test pos. He is encouraged to gargle with salt water.  ?- POCT rapid strep A ?- amoxicillin (AMOXIL) 500 MG capsule; Take 1 capsule (500 mg total) by mouth 2 (two) times daily.  Dispense: 20 capsule; Refill: 0 ? ?2. Strep throat ?Comments: He was given rx amoxicillin '500mg'$  bid x 10 days. He is encouraged to complete full abx course. He prefers tablets over liquid rx.  ?- POCT rapid strep A ?- amoxicillin (AMOXIL) 500 MG capsule; Take 1 capsule (500 mg total) by mouth 2 (two) times daily.  Dispense: 20 capsule; Refill: 0 ? ?3. Essential hypertension, benign ?Comments: Chronic, uncontrolled. I will not change meds. He will rto in 4 weeks for a physical exam.  ?  ?Patient was given opportunity to ask questions. Patient verbalized understanding of the plan and was able to repeat key elements of the plan. All questions were answered to their satisfaction.  ? ?I, [name] Greenland, MD, have reviewed all documentation for this visit. The documentation on 04/01/22 for the exam, diagnosis, procedures, and orders are all accurate and complete.  ? ?IF YOU HAVE BEEN REFERRED TO A SPECIALIST, IT MAY TAKE 1-2 WEEKS TO SCHEDULE/PROCESS THE REFERRAL. IF YOU HAVE NOT HEARD FROM US/SPECIALIST IN TWO WEEKS, PLEASE GIVE [place] A CALL AT [phone].  ? ?THE PATIENT IS ENCOURAGED TO PRACTICE SOCIAL DISTANCING DUE TO THE  COVID-19 PANDEMIC.   ?

## 2022-04-25 ENCOUNTER — Ambulatory Visit (INDEPENDENT_AMBULATORY_CARE_PROVIDER_SITE_OTHER): Payer: BC Managed Care – PPO | Admitting: Internal Medicine

## 2022-04-25 ENCOUNTER — Encounter: Payer: Self-pay | Admitting: Internal Medicine

## 2022-04-25 VITALS — BP 140/90 | HR 86 | Temp 98.4°F | Ht 67.4 in | Wt 384.4 lb

## 2022-04-25 DIAGNOSIS — J029 Acute pharyngitis, unspecified: Secondary | ICD-10-CM

## 2022-04-25 DIAGNOSIS — Z6841 Body Mass Index (BMI) 40.0 and over, adult: Secondary | ICD-10-CM

## 2022-04-25 DIAGNOSIS — M170 Bilateral primary osteoarthritis of knee: Secondary | ICD-10-CM | POA: Diagnosis not present

## 2022-04-25 DIAGNOSIS — Z0000 Encounter for general adult medical examination without abnormal findings: Secondary | ICD-10-CM

## 2022-04-25 DIAGNOSIS — I10 Essential (primary) hypertension: Secondary | ICD-10-CM | POA: Diagnosis not present

## 2022-04-25 DIAGNOSIS — F1520 Other stimulant dependence, uncomplicated: Secondary | ICD-10-CM

## 2022-04-25 LAB — POCT URINALYSIS DIPSTICK
Bilirubin, UA: NEGATIVE
Blood, UA: NEGATIVE
Glucose, UA: NEGATIVE
Ketones, UA: NEGATIVE
Leukocytes, UA: NEGATIVE
Nitrite, UA: NEGATIVE
Protein, UA: NEGATIVE
Spec Grav, UA: 1.02 (ref 1.010–1.025)
Urobilinogen, UA: 0.2 E.U./dL
pH, UA: 5.5 (ref 5.0–8.0)

## 2022-04-25 LAB — POCT RAPID STREP A (OFFICE): Rapid Strep A Screen: NEGATIVE

## 2022-04-25 MED ORDER — KETOROLAC TROMETHAMINE 30 MG/ML IJ SOLN
30.0000 mg | Freq: Once | INTRAMUSCULAR | Status: AC
  Administered 2022-04-25: 30 mg via INTRAMUSCULAR

## 2022-04-25 MED ORDER — MONTELUKAST SODIUM 10 MG PO TABS: 10.0000 mg | ORAL_TABLET | Freq: Every day | ORAL | 2 refills | Status: DC

## 2022-04-25 NOTE — Progress Notes (Signed)
[name],acting as a Education administrator for [name], MD.,have documented all relevant documentation on the behalf of [name], MD,as directed by  [name], MD while in the presence of [name], MD.  This visit occurred during the SARS-CoV-2 public health emergency.  Safety protocols were in place, including screening questions prior to the visit, additional usage of staff PPE, and extensive cleaning of exam room while observing appropriate contact time as indicated for disinfecting solutions.  Subjective:     Patient ID: [name] Gordon , male    DOB: 27-Oct-1976 , 46 y.o.   MRN: [id number]   Chief Complaint  Patient presents with   Annual Exam   Hypertension    HPI  The patient is here today for a physical examination. Patient reports compliance with his meds. He denies headache, chest pains and shortness of breath.  Hypertension This is a chronic problem. The current episode started more than 1 year ago. The problem is unchanged. The problem is controlled. Pertinent negatives include no anxiety. There are no associated agents to hypertension. Risk factors for coronary artery disease include obesity, sedentary lifestyle and male gender. Past treatments include angiotensin blockers, calcium channel blockers and lifestyle changes. There are no compliance problems.     Past Medical History:  Diagnosis Date   Fatty liver 2017   Mild   History of gout    Hypertension    Morbid obesity (HCC)    OA (osteoarthritis) of knee    Spinal stenosis 2018     Family History  Problem Relation Age of Onset   Hypertension Mother    Diabetes Mother    Cancer Father      Current Outpatient Medications:    amlodipine-olmesartan (AZOR) 10-20 MG tablet, TAKE 1 TABLET BY MOUTH EVERY DAY, Disp: 90 tablet, Rfl: 1   cholecalciferol (VITAMIN D3) 25 MCG (1000 UT) tablet, Take 1,000 Units by mouth daily., Disp: , Rfl:    colchicine 0.6 MG tablet, Take 2 tablets by mouth at  the onset of gout pain and then 1 tablet in 1 hour. Do not take more than 3 tablets per gout flare up., Disp: 30 tablet, Rfl: 0   montelukast (SINGULAIR) 10 MG tablet, Take 1 tablet (10 mg total) by mouth daily., Disp: 30 tablet, Rfl: 2   nebivolol (BYSTOLIC) 10 MG tablet, TAKE 1 TABLET BY MOUTH EVERY DAY, Disp: 90 tablet, Rfl: 1   HYDROcodone-acetaminophen (NORCO/VICODIN) 5-325 MG tablet, Take 1 tablet by mouth every 6 (six) hours as needed for moderate pain., Disp: 30 tablet, Rfl: 0   No Known Allergies   Men's preventive visit. Patient Health Questionnaire (PHQ-2) is  [place] [facility] Visit from 04/25/2022 in [facility]  PHQ-2 Total Score 0     . Patient is on a low sodium diet. Marital status: Married. Relevant history for alcohol use is:  Social History   Substance and Sexual Activity  Alcohol Use No  . Relevant history for tobacco use is:  Social History   Tobacco Use  Smoking Status Never  Smokeless Tobacco Never  .   Review of Systems  Constitutional: Negative.   HENT:  Positive for sore throat.        He c/o sore throat. Denies fever/chills. Had recent infection, wants to be rechecked for Strep  Eyes: Negative.   Respiratory: Negative.    Cardiovascular: Negative.   Gastrointestinal: Negative.   Endocrine: Negative.   Genitourinary: Negative.   Musculoskeletal:  Positive for arthralgias.       He c/o worsening knee pain. Denies fall/trauma. Has h/o knee osteoarthritis. He has been eval by Ortho, knee replacement is needed. However, he must get BMI<40 prior to surgery.   Skin: Negative.   Neurological: Negative.   Hematological: Negative.   Psychiatric/Behavioral: Negative.      Today's Vitals   04/25/22 1107 04/25/22 1204  BP: 138/90 140/90  Pulse: 86   Temp: 98.4 F (36.9 C)   Weight: (!) 384 lb 6.4 oz (174.4 kg)   Height: 5' 7.4" (1.712 m)   PainSc: 10-Worst pain ever   PainLoc: Knee    Body mass index is 59.49 kg/m.  Wt  Readings from Last 3 Encounters:  04/25/22 (!) 384 lb 6.4 oz (174.4 kg)  04/01/22 (!) 382 lb 12.8 oz (173.6 kg)  04/16/21 (!) 389 lb 12.8 oz (176.8 kg)    BP Readings from Last 3 Encounters:  04/25/22 140/90  04/01/22 (!) 138/98  10/26/21 (!) 174/95   Objective:  Physical Exam Vitals and nursing note reviewed.  Constitutional:      Appearance: Normal appearance. He is obese.  HENT:     Head: Normocephalic and atraumatic.     Right Ear: Tympanic membrane, ear canal and external ear normal.     Left Ear: Tympanic membrane, ear canal and external ear normal.     Mouth/Throat:     Mouth: Mucous membranes are moist.     Pharynx: Oropharynx is clear. Posterior oropharyngeal erythema present.  Eyes:     Extraocular Movements: Extraocular movements intact.     Conjunctiva/sclera: Conjunctivae normal.     Pupils: Pupils are equal, round, and reactive to light.  Cardiovascular:     Rate and Rhythm: Normal rate and regular rhythm.     Pulses: Normal pulses.          Dorsalis pedis pulses are 2+ on the right side and 2+ on the left side.     Heart sounds: Normal heart sounds.  Pulmonary:     Effort: Pulmonary effort is normal.     Breath sounds: Normal breath sounds.  Chest:  Breasts:    Right: Normal. No swelling, bleeding, inverted nipple, mass or nipple discharge.     Left: Normal. No swelling, bleeding, inverted nipple, mass or nipple discharge.  Abdominal:     General: Bowel sounds are normal.     Palpations: Abdomen is soft.     Comments: Obese, soft. Unable to assess organomegaly due to body habitus  Genitourinary:    Prostate: Normal.     Rectum: Normal. Guaiac result negative.  Musculoskeletal:        General: Normal range of motion.     Cervical back: Normal range of motion and neck supple.  Skin:    General: Skin is warm.  Neurological:     General: No focal deficit present.     Mental Status: He is alert.  Psychiatric:        Mood and Affect: Mood normal.         Behavior: Behavior normal.     Assessment And Plan:    1. Encounter for general adult medical examination w/o abnormal findings Comments: A full exam was performed. He plans to schedule Urology appt for prostate exam. DRE declined by patient.  PATIENT IS ADVISED TO GET 30-45 MINUTES REGULAR EXERCISE NO LESS THAN FOUR TO FIVE DAYS PER WEEK - BOTH WEIGHTBEARING EXERCISES AND AEROBIC ARE RECOMMENDED.  PATIENT IS ADVISED TO  FOLLOW A HEALTHY DIET WITH AT LEAST SIX FRUITS/VEGGIES PER DAY, DECREASE INTAKE OF RED MEAT, AND TO INCREASE FISH INTAKE TO TWO DAYS PER WEEK.  MEATS/FISH SHOULD NOT BE FRIED, BAKED OR BROILED IS PREFERABLE.  IT IS ALSO IMPORTANT TO CUT BACK ON YOUR SUGAR INTAKE. PLEASE AVOID ANYTHING WITH ADDED SUGAR, CORN SYRUP OR OTHER SWEETENERS. IF YOU MUST USE A SWEETENER, YOU CAN TRY STEVIA. IT IS ALSO IMPORTANT TO AVOID ARTIFICIALLY SWEETENERS AND DIET BEVERAGES. LASTLY, I SUGGEST WEARING SPF 50 SUNSCREEN ON EXPOSED PARTS AND ESPECIALLY WHEN IN THE DIRECT SUNLIGHT FOR AN EXTENDED PERIOD OF TIME.  PLEASE AVOID FAST FOOD RESTAURANTS AND INCREASE YOUR WATER INTAKE.  - CMP14+EGFR - Lipid panel - CBC no Diff - PSA - Hemoglobin A1c  2. Essential hypertension, benign Comments: Uncontrolled. EKG performed, NSR w/o acute changes. He is advised to take one medication in am, other in PM for better 24 hour coverage. He agrees to rto in 3 weeks for a nurse visit. I will add diuretic if needed.  - POCT Urinalysis Dipstick (81002) - Microalbumin / Creatinine Urine Ratio - EKG 12-Lead  3. Sore throat Comments: Strep test is negative. His sx are likely due to PND. - POCT rapid strep A  4. Primary osteoarthritis of both knees Comments: Chronic, I will send rx Vicodin to use prn. He was also given Toradol, 44m IM x 1.  - ketorolac (TORADOL) 30 MG/ML injection 30 mg  5. Caffeine dependence, continuous ([place] Comments: Neuro notes evaluated. He states he has cut back on his caffeine intake.   6.  Class 3 severe obesity due to excess calories with serious comorbidity and body mass index (BMI) of 50.0 to 59.9 in adult ([facility] He is encouraged to initially strive for BMI less than 50 to decrease cardiac risk. He is advised to exercise no less than 150 minutes per week.  He declines rx weight loss medication, I do think he would benefit from weekly Wegovy.     Patient was given opportunity to ask questions. Patient verbalized understanding of the plan and was able to repeat key elements of the plan. All questions were answered to their satisfaction.   I, RMaximino Greenland MD, have reviewed all documentation for this visit. The documentation on 04/25/22 for the exam, diagnosis, procedures, and orders are all accurate and complete.   THE PATIENT IS ENCOURAGED TO PRACTICE SOCIAL DISTANCING DUE TO THE COVID-19 PANDEMIC.

## 2022-04-25 NOTE — Patient Instructions (Signed)
Health Maintenance, Male Adopting a healthy lifestyle and getting preventive care are important in promoting health and wellness. Ask your health care provider about: The right schedule for you to have regular tests and exams. Things you can do on your own to prevent diseases and keep yourself healthy. What should I know about diet, weight, and exercise? Eat a healthy diet  Eat a diet that includes plenty of vegetables, fruits, low-fat dairy products, and lean protein. Do not eat a lot of foods that are high in solid fats, added sugars, or sodium. Maintain a healthy weight Body mass index (BMI) is a measurement that can be used to identify possible weight problems. It estimates body fat based on height and weight. Your health care provider can help determine your BMI and help you achieve or maintain a healthy weight. Get regular exercise Get regular exercise. This is one of the most important things you can do for your health. Most adults should: Exercise for at least 150 minutes each week. The exercise should increase your heart rate and make you sweat (moderate-intensity exercise). Do strengthening exercises at least twice a week. This is in addition to the moderate-intensity exercise. Spend less time sitting. Even light physical activity can be beneficial. Watch cholesterol and blood lipids Have your blood tested for lipids and cholesterol at 48 years of age, then have this test every 5 years. You may need to have your cholesterol levels checked more often if: Your lipid or cholesterol levels are high. You are older than 48 years of age. You are at high risk for heart disease. What should I know about cancer screening? Many types of cancers can be detected early and may often be prevented. Depending on your health history and family history, you may need to have cancer screening at various ages. This may include screening for: Colorectal cancer. Prostate cancer. Skin cancer. Lung  cancer. What should I know about heart disease, diabetes, and high blood pressure? Blood pressure and heart disease High blood pressure causes heart disease and increases the risk of stroke. This is more likely to develop in people who have high blood pressure readings or are overweight. Talk with your health care provider about your target blood pressure readings. Have your blood pressure checked: Every 3-5 years if you are 18-39 years of age. Every year if you are 40 years old or older. If you are between the ages of 65 and 75 and are a current or former smoker, ask your health care provider if you should have a one-time screening for abdominal aortic aneurysm (AAA). Diabetes Have regular diabetes screenings. This checks your fasting blood sugar level. Have the screening done: Once every three years after age 45 if you are at a normal weight and have a low risk for diabetes. More often and at a younger age if you are overweight or have a high risk for diabetes. What should I know about preventing infection? Hepatitis B If you have a higher risk for hepatitis B, you should be screened for this virus. Talk with your health care provider to find out if you are at risk for hepatitis B infection. Hepatitis C Blood testing is recommended for: Everyone born from 1945 through 1965. Anyone with known risk factors for hepatitis C. Sexually transmitted infections (STIs) You should be screened each year for STIs, including gonorrhea and chlamydia, if: You are sexually active and are younger than 48 years of age. You are older than 48 years of age and your   health care provider tells you that you are at risk for this type of infection. Your sexual activity has changed since you were last screened, and you are at increased risk for chlamydia or gonorrhea. Ask your health care provider if you are at risk. Ask your health care provider about whether you are at high risk for HIV. Your health care provider  may recommend a prescription medicine to help prevent HIV infection. If you choose to take medicine to prevent HIV, you should first get tested for HIV. You should then be tested every 3 months for as long as you are taking the medicine. Follow these instructions at home: Alcohol use Do not drink alcohol if your health care provider tells you not to drink. If you drink alcohol: Limit how much you have to 0-2 drinks a day. Know how much alcohol is in your drink. In the [place] one drink equals one 12 oz bottle of beer (355 mL), one 5 oz glass of wine (148 mL), or one 1 oz glass of hard liquor (44 mL). Lifestyle Do not use any products that contain nicotine or tobacco. These products include cigarettes, chewing tobacco, and vaping devices, such as e-cigarettes. If you need help quitting, ask your health care provider. Do not use street drugs. Do not share needles. Ask your health care provider for help if you need support or information about quitting drugs. General instructions Schedule regular health, dental, and eye exams. Stay current with your vaccines. Tell your health care provider if: You often feel depressed. You have ever been abused or do not feel safe at home. Summary Adopting a healthy lifestyle and getting preventive care are important in promoting health and wellness. Follow your health care provider's instructions about healthy diet, exercising, and getting tested or screened for diseases. Follow your health care provider's instructions on monitoring your cholesterol and blood pressure. This information is not intended to replace advice given to you by your health care provider. Make sure you discuss any questions you have with your health care provider. Document Revised: 04/16/2021 Document Reviewed: 04/16/2021 Elsevier Patient Education  2023 Elsevier Inc.  

## 2022-04-26 LAB — CMP14+EGFR
ALT: 43 IU/L (ref 0–44)
AST: 29 IU/L (ref 0–40)
Albumin/Globulin Ratio: 1.4 (ref 1.2–2.2)
Albumin: 4.1 g/dL (ref 4.0–5.0)
Alkaline Phosphatase: 90 IU/L (ref 44–121)
BUN/Creatinine Ratio: 12 (ref 9–20)
BUN: 11 mg/dL (ref 6–24)
Bilirubin Total: 0.5 mg/dL (ref 0.0–1.2)
CO2: 24 mmol/L (ref 20–29)
Calcium: 9.5 mg/dL (ref 8.7–10.2)
Chloride: 104 mmol/L (ref 96–106)
Creatinine, Ser: 0.93 mg/dL (ref 0.76–1.27)
Globulin, Total: 3 g/dL (ref 1.5–4.5)
Glucose: 110 mg/dL — ABNORMAL HIGH (ref 70–99)
Potassium: 4 mmol/L (ref 3.5–5.2)
Sodium: 141 mmol/L (ref 134–144)
Total Protein: 7.1 g/dL (ref 6.0–8.5)
eGFR: 102 mL/min/{1.73_m2} (ref >59)

## 2022-04-26 LAB — LIPID PANEL
Chol/HDL Ratio: 3.2 ratio (ref 0.0–5.0)
Cholesterol, Total: 159 mg/dL (ref 100–199)
HDL: 50 mg/dL (ref >39)
LDL Chol Calc (NIH): 96 mg/dL (ref 0–99)
Triglycerides: 68 mg/dL (ref 0–149)
VLDL Cholesterol Cal: 13 mg/dL (ref 5–40)

## 2022-04-26 LAB — CBC
Hematocrit: 41.8 % (ref 37.5–51.0)
Hemoglobin: 13.8 g/dL (ref 13.0–17.7)
MCH: 27.2 pg (ref 26.6–33.0)
MCHC: 33 g/dL (ref 31.5–35.7)
MCV: 82 fL (ref 79–97)
Platelets: 291 10*3/uL (ref 150–450)
RBC: 5.08 x10E6/uL (ref 4.14–5.80)
RDW: 14.1 % (ref 11.6–15.4)
WBC: 10.2 10*3/uL (ref 3.4–10.8)

## 2022-04-26 LAB — MICROALBUMIN / CREATININE URINE RATIO
Creatinine, Urine: 130 mg/dL
Microalb/Creat Ratio: 8 mg/g creat (ref 0–29)
Microalbumin, Urine: 10.3 ug/mL

## 2022-04-26 LAB — PSA: Prostate Specific Ag, Serum: 10.8 ng/mL — ABNORMAL HIGH (ref 0.0–4.0)

## 2022-04-26 LAB — HEMOGLOBIN A1C
Est. average glucose Bld gHb Est-mCnc: 123 mg/dL
Hgb A1c MFr Bld: 5.9 % — ABNORMAL HIGH (ref 4.8–5.6)

## 2022-04-28 MED ORDER — HYDROCODONE-ACETAMINOPHEN 5-325 MG PO TABS: 1.0000 | ORAL_TABLET | Freq: Four times a day (QID) | ORAL | 0 refills | Status: DC | PRN

## 2022-05-28 ENCOUNTER — Ambulatory Visit (INDEPENDENT_AMBULATORY_CARE_PROVIDER_SITE_OTHER): Payer: BC Managed Care – PPO | Admitting: Internal Medicine

## 2022-05-28 VITALS — BP 132/88 | HR 73 | Temp 98.1°F | Ht 67.0 in | Wt 384.0 lb

## 2022-05-28 DIAGNOSIS — I10 Essential (primary) hypertension: Secondary | ICD-10-CM | POA: Diagnosis not present

## 2022-05-28 DIAGNOSIS — M170 Bilateral primary osteoarthritis of knee: Secondary | ICD-10-CM

## 2022-05-28 NOTE — Progress Notes (Addendum)
Pt presents today for BPC. He denies headache, blurred vision, chest pain, SOB.  Pt reports taking nebivolol '10MG'$ , in the morning & amlodipine-olmesartan 10-'20MG'$  in the afternoon.  BP Readings from Last 3 Encounters:  07/02/22 138/74  05/28/22 132/88  04/25/22 140/90  Provider reports pt should come back in a month for nurse visit for [place]. While patient Is here today he mentioned experiencing more pain. Hydrocodone medication he states doe snot really work for him. He needs something stronger. Provider notified, pt agreed to pain clinic referral.     This visit occurred during the SARS-CoV-2 public health emergency.  Safety protocols were in place, including screening questions prior to the visit, additional usage of staff PPE, and extensive cleaning of exam room while observing appropriate contact time as indicated for disinfecting solutions.  Subjective:     Patient ID: [name] Gordon , male    DOB: [birth year] , 46 y.o.   MRN: [id number]   Chief Complaint  Patient presents with   Hypertension    HPI  He was initially scheduled for nurse visit, switched to OV due to pain.  Pt presents today for BPC. He denies headache, blurred vision, chest pain, SOB.  Pt reports taking nebivolol '10MG'$ , in the morning & amlodipine-olmesartan 10-'20MG'$  in the afternoon.      Past Medical History:  Diagnosis Date   Fatty liver 2017   Mild   History of gout    Hypertension    Morbid obesity (HCC)    OA (osteoarthritis) of knee    Spinal stenosis 2018     Family History  Problem Relation Age of Onset   Hypertension Mother    Diabetes Mother    Cancer Father      Current Outpatient Medications:    cholecalciferol (VITAMIN D3) 25 MCG (1000 UT) tablet, Take 1,000 Units by mouth daily., Disp: , Rfl:    colchicine 0.6 MG tablet, Take 2 tablets by mouth at the onset of gout pain and then 1 tablet in 1 hour. Do not take more than 3 tablets per gout flare up., Disp: 30 tablet, Rfl: 0    amlodipine-olmesartan (AZOR) 10-20 MG tablet, Take 1 tablet by mouth daily., Disp: 90 tablet, Rfl: 1   montelukast (SINGULAIR) 10 MG tablet, TAKE 1 TABLET BY MOUTH EVERY DAY, Disp: 90 tablet, Rfl: 1   nebivolol (BYSTOLIC) 10 MG tablet, Take 1 tablet (10 mg total) by mouth daily., Disp: 90 tablet, Rfl: 1   oxyCODONE-acetaminophen (PERCOCET) 5-325 MG tablet, Take 1 tablet by mouth every 8 (eight) hours as needed for severe pain., Disp: 30 tablet, Rfl: 0   No Known Allergies   Review of Systems  Constitutional: Negative.   Respiratory: Negative.    Cardiovascular: Negative.   Gastrointestinal: Negative.   Musculoskeletal:  Positive for joint pain.  Neurological: Negative.   Psychiatric/Behavioral: Negative.      Review of Systems  Constitutional: Negative.   Respiratory: Negative.    Cardiovascular: Negative.   Gastrointestinal: Negative.   Musculoskeletal:  Positive for joint pain.  Neurological: Negative.   Psychiatric/Behavioral: Negative.       Today's Vitals   05/28/22 0830  BP: 132/88  Pulse: 73  Temp: 98.1 F (36.7 C)  SpO2: 98%  Weight: (!) 384 lb (174.2 kg)  Height: '5\' 7"'$  (1.702 m)  PainSc: 0-No pain   Body mass index is 60.14 kg/m.   Objective:  Physical Exam Vitals and nursing note reviewed.  Constitutional:      Appearance: Normal appearance.  HENT:     Head: Normocephalic and atraumatic.  Eyes:     Extraocular Movements: Extraocular movements intact.  Cardiovascular:     Rate and Rhythm: Normal rate and regular rhythm.     Heart sounds: Normal heart sounds.  Pulmonary:     Effort: Pulmonary effort is normal.     Breath sounds: Normal breath sounds.  Musculoskeletal:     Cervical back: Normal range of motion.  Skin:    General: Skin is warm.  Neurological:     General: No focal deficit present.     Mental Status: He is alert.  Psychiatric:        Mood and Affect: Mood normal.         Assessment And Plan:     1. Essential hypertension,  benign Comments: Likely elevated due to pain. No change in meds, importance of dietary/medication compliance was d/w patient.   2. Primary osteoarthritis of both knees Comments: I will send rx Vicodin prn.    Patient was given opportunity to ask questions. Patient verbalized understanding of the plan and was able to repeat key elements of the plan. All questions were answered to their satisfaction.   I, [name], MD, have reviewed all documentation for this visit. The documentation on 09/04/22 for the exam, diagnosis, procedures, and orders are all accurate and complete.   IF YOU HAVE BEEN REFERRED TO A SPECIALIST, IT MAY TAKE 1-2 WEEKS TO SCHEDULE/PROCESS THE REFERRAL. IF YOU HAVE NOT HEARD FROM US/SPECIALIST IN TWO WEEKS, PLEASE GIVE [place] A CALL AT [phone].   THE PATIENT IS ENCOURAGED TO PRACTICE SOCIAL DISTANCING DUE TO THE COVID-19 PANDEMIC.

## 2022-05-28 NOTE — Progress Notes (Incomplete Revision)
Pt presents today for BPC. He denies headache, blurred vision, chest pain, SOB.  Pt reports taking nebivolol 10MG, in the morning & amlodipine-olmesartan 10-20MG in the afternoon.  BP Readings from Last 3 Encounters:  05/28/22 132/88  04/25/22 140/90  04/01/22 (!) 138/98  Provider reports pt should come back in a month for nurse visit for BPC. While patient Is here today he mentioned experiencing more pain. Hydrocodone medication he states doe snot really work for him. He needs something stronger. Provider notified, pt agreed to pain clinic referral.     

## 2022-05-28 NOTE — Progress Notes (Addendum)
Pt presents today for BPC. He denies headache, blurred vision, chest pain, SOB.  Pt reports taking nebivolol '10MG'$ , in the morning & amlodipine-olmesartan 10-'20MG'$  in the afternoon.  BP Readings from Last 3 Encounters:  05/28/22 132/88  04/25/22 140/90  04/01/22 (!) 138/98  Provider reports pt should come back in a month for nurse visit for [place]. While patient Is here today he mentioned experiencing more pain. Hydrocodone medication he states doe snot really work for him. He needs something stronger. Provider notified, pt agreed to pain clinic referral.

## 2022-05-29 ENCOUNTER — Other Ambulatory Visit: Payer: Self-pay | Admitting: Internal Medicine

## 2022-05-29 MED ORDER — OXYCODONE-ACETAMINOPHEN 5-325 MG PO TABS: 1.0000 | ORAL_TABLET | Freq: Three times a day (TID) | ORAL | 0 refills | Status: DC | PRN

## 2022-07-02 ENCOUNTER — Other Ambulatory Visit: Payer: Self-pay

## 2022-07-02 ENCOUNTER — Ambulatory Visit: Payer: BC Managed Care – PPO

## 2022-07-02 VITALS — BP 138/74 | HR 67 | Temp 98.3°F

## 2022-07-02 DIAGNOSIS — I10 Essential (primary) hypertension: Secondary | ICD-10-CM

## 2022-07-02 MED ORDER — NEBIVOLOL HCL 10 MG PO TABS: 10.0000 mg | ORAL_TABLET | Freq: Every day | ORAL | 1 refills | Status: DC

## 2022-07-02 MED ORDER — AMLODIPINE-OLMESARTAN 10-20 MG PO TABS: 1.0000 | ORAL_TABLET | Freq: Every day | ORAL | 1 refills | Status: DC

## 2022-07-02 NOTE — Progress Notes (Signed)
Patient presents today for BPC.  Amlodipine-olmesartan 10-'20mg'$  and bystolic '10mg'$ .   BP Readings from Last 3 Encounters:  07/02/22 138/74  05/28/22 132/88  04/25/22 140/90   Spoke with provider she states that if not better in 3 months, will add diuretic - hctz 25

## 2022-07-23 ENCOUNTER — Other Ambulatory Visit: Payer: Self-pay | Admitting: Urology

## 2022-07-23 DIAGNOSIS — R972 Elevated prostate specific antigen [PSA]: Secondary | ICD-10-CM

## 2022-07-27 ENCOUNTER — Other Ambulatory Visit: Payer: Self-pay | Admitting: Internal Medicine

## 2022-09-04 ENCOUNTER — Encounter: Payer: Self-pay | Admitting: Internal Medicine

## 2022-09-04 NOTE — Addendum Note (Signed)
Addended by: [name] on: 09/04/2022 03:48 PM   Modules accepted: Level of Service

## 2022-09-17 ENCOUNTER — Ambulatory Visit
Admission: RE | Admit: 2022-09-17 | Discharge: 2022-09-17 | Disposition: A | Discharge status: Home / Self Care | Payer: BC Managed Care – PPO | Source: Ambulatory Visit | Attending: Urology | Admitting: Urology

## 2022-09-17 DIAGNOSIS — R972 Elevated prostate specific antigen [PSA]: Secondary | ICD-10-CM

## 2022-10-28 ENCOUNTER — Other Ambulatory Visit (HOSPITAL_COMMUNITY): Payer: Self-pay

## 2022-10-28 ENCOUNTER — Encounter: Payer: Self-pay | Admitting: Internal Medicine

## 2022-10-28 ENCOUNTER — Ambulatory Visit: Payer: BC Managed Care – PPO | Admitting: Internal Medicine

## 2022-10-28 VITALS — BP 124/80 | HR 76 | Temp 98.0°F | Ht 67.0 in | Wt 397.4 lb

## 2022-10-28 DIAGNOSIS — R7309 Other abnormal glucose: Secondary | ICD-10-CM | POA: Diagnosis not present

## 2022-10-28 DIAGNOSIS — M1711 Unilateral primary osteoarthritis, right knee: Secondary | ICD-10-CM | POA: Diagnosis not present

## 2022-10-28 DIAGNOSIS — I10 Essential (primary) hypertension: Secondary | ICD-10-CM | POA: Diagnosis not present

## 2022-10-28 DIAGNOSIS — Z23 Encounter for immunization: Secondary | ICD-10-CM | POA: Diagnosis not present

## 2022-10-28 DIAGNOSIS — Z6841 Body Mass Index (BMI) 40.0 and over, adult: Secondary | ICD-10-CM

## 2022-10-28 MED ORDER — AMLODIPINE-OLMESARTAN 10-20 MG PO TABS: 1.0000 | ORAL_TABLET | Freq: Every day | ORAL | 1 refills | Status: DC

## 2022-10-28 MED ORDER — OXYCODONE-ACETAMINOPHEN 5-325 MG PO TABS: 1.0000 | ORAL_TABLET | Freq: Three times a day (TID) | ORAL | 0 refills | Status: DC | PRN

## 2022-10-28 MED ORDER — WEGOVY 1 MG/0.5ML ~~LOC~~ SOAJ
1.0000 mg | SUBCUTANEOUS | 1 refills | Status: DC
  Filled 2022-10-28 – 2022-12-04 (×2): qty 2, 28d supply, fill #0

## 2022-10-28 MED ORDER — NEBIVOLOL HCL 10 MG PO TABS: 10.0000 mg | ORAL_TABLET | Freq: Every day | ORAL | 1 refills | Status: DC

## 2022-10-28 NOTE — Progress Notes (Signed)
[name],acting as a Education administrator for [name], MD.,have documented all relevant documentation on the behalf of [name], MD,as directed by  [name], MD while in the presence of [name], MD.    Subjective:     Patient ID: [name] Gordon , male    DOB: September 25, 1976 , 46 y.o.   MRN: [id number]   Chief Complaint  Patient presents with   Hypertension    HPI  Patient presents today for a bp check, he reports compliance with medication and has no other issues today.  He denies having any headaches, chest pain and shortness of breath.   Hypertension This is a chronic problem. The current episode started more than 1 year ago. The problem is unchanged. The problem is controlled. Pertinent negatives include no anxiety. There are no associated agents to hypertension. Risk factors for coronary artery disease include obesity, sedentary lifestyle and male gender. Past treatments include angiotensin blockers, calcium channel blockers and lifestyle changes. There are no compliance problems.      Past Medical History:  Diagnosis Date   Fatty liver 2017   Mild   History of gout    Hypertension    Morbid obesity (HCC)    OA (osteoarthritis) of knee    Spinal stenosis 2018     Family History  Problem Relation Age of Onset   Hypertension Mother    Diabetes Mother    Cancer Father      Current Outpatient Medications:    cholecalciferol (VITAMIN D3) 25 MCG (1000 UT) tablet, Take 1,000 Units by mouth daily., Disp: , Rfl:    colchicine 0.6 MG tablet, Take 2 tablets by mouth at the onset of gout pain and then 1 tablet in 1 hour. Do not take more than 3 tablets per gout flare up., Disp: 30 tablet, Rfl: 0   Semaglutide-Weight Management (WEGOVY) 1 MG/0.5ML SOAJ, Inject 1 mg into the skin once a week., Disp: 2 mL, Rfl: 1   amlodipine-olmesartan (AZOR) 10-20 MG tablet, Take 1 tablet by mouth daily., Disp: 90 tablet, Rfl: 1   montelukast (SINGULAIR) 10 MG tablet,  TAKE 1 TABLET BY MOUTH EVERY DAY (Patient not taking: Reported on 10/28/2022), Disp: 90 tablet, Rfl: 1   nebivolol (BYSTOLIC) 10 MG tablet, Take 1 tablet (10 mg total) by mouth daily., Disp: 90 tablet, Rfl: 1   oxyCODONE-acetaminophen (PERCOCET) 5-325 MG tablet, Take 1 tablet by mouth every 8 (eight) hours as needed for severe pain., Disp: 30 tablet, Rfl: 0   No Known Allergies   Review of Systems  Constitutional: Negative.   Eyes: Negative.   Respiratory: Negative.    Cardiovascular: Negative.   Gastrointestinal: Negative.   Musculoskeletal: Negative.   Skin: Negative.   Neurological: Negative.   Psychiatric/Behavioral: Negative.       Today's Vitals   10/28/22 0818  BP: 124/80  Pulse: 76  Temp: 98 F (36.7 C)  Weight: (!) 397 lb 6.4 oz (180.3 kg)  Height: _0  (1.702 m)  PainSc: 10-Worst pain ever  PainLoc: Knee   Body mass index is 62.24 kg/m.  Wt Readings from Last 3 Encounters:  10/28/22 (!) 397 lb 6.4 oz (180.3 kg)  05/28/22 (!) 384 lb (174.2 kg)  04/25/22 (!) 384 lb 6.4 oz (174.4 kg)     Objective:  Physical Exam Vitals and nursing note reviewed.  Constitutional:      Appearance: Normal appearance.  HENT:     Head: Normocephalic and atraumatic.  Nose:     Comments: Masked     Mouth/Throat:     Comments: Masked  Eyes:     Extraocular Movements: Extraocular movements intact.  Cardiovascular:     Rate and Rhythm: Normal rate and regular rhythm.     Heart sounds: Normal heart sounds.  Pulmonary:     Effort: Pulmonary effort is normal.     Breath sounds: Normal breath sounds.  Musculoskeletal:     Cervical back: Normal range of motion.  Skin:    General: Skin is warm.  Neurological:     General: No focal deficit present.     Mental Status: He is alert.  Psychiatric:        Mood and Affect: Mood normal.      Assessment And Plan:     1. Essential hypertension, benign Comments: Chronic, well controlled. He will c/w generic Azor 16/10RU and  Bystolic 04VW daily. He is encouraged to follow low sodium diet. - CMP14+EGFR  2. Primary osteoarthritis of right knee Comments: Chronic, I will refer him to Ortho for updated x-rays and possible steroid injection. PDMP reviewed, I will send refill of oxycodone prn. - Ambulatory referral to Orthopedic Surgery  3. Other abnormal glucose Comments: His a1c has been elevated in the past. I will recheck this today. He is encouraged to cut back on his soda intake. - CMP14+EGFR - Hemoglobin A1c  4. Class 3 severe obesity due to excess calories with serious comorbidity and body mass index (BMI) of 60.0 to 69.9 in adult [facility]) Comments: He was advised of 13lb weight gain, encouraged to cut back on his soda intake.  He has had difficulty in losing weight on his own, previously on Wegovy. Agrees to try again.  He understands concerns for product shortages and the concerns of starting/stopping in relation to GI distress.    Confirmed patient has no personal or family history of medullary thyroid carcinoma (MTC) or Multiple Endocrine Neoplasia syndrome type 2 (MEN 2).   Advised patient on common side effects including nausea, diarrhea, dyspepsia, decreased appetite, and fatigue. Counseled patient on reducing meal size and how to titrate medication to minimize side effects. Patient aware to call if intolerable side effects or if experiencing dehydration, abdominal pain, or dizziness. Patient will adhere to dietary modifications and will target at least 150 minutes of moderate intensity exercise weekly.  He will f/u in four to six weeks.   5. Immunization due - Flu Vaccine QUAD 6+ mos PF IM (Fluarix Quad PF)   Patient was given opportunity to ask questions. Patient verbalized understanding of the plan and was able to repeat key elements of the plan. All questions were answered to their satisfaction.   I, [name], MD, have reviewed all documentation for this visit. The documentation on 10/28/22 for the  exam, diagnosis, procedures, and orders are all accurate and complete.   IF YOU HAVE BEEN REFERRED TO A SPECIALIST, IT MAY TAKE 1-2 WEEKS TO SCHEDULE/PROCESS THE REFERRAL. IF YOU HAVE NOT HEARD FROM US/SPECIALIST IN TWO WEEKS, PLEASE GIVE [place] A CALL AT [phone].   THE PATIENT IS ENCOURAGED TO PRACTICE SOCIAL DISTANCING DUE TO THE COVID-19 PANDEMIC.

## 2022-10-28 NOTE — Patient Instructions (Signed)
Hypertension, Adult ?Hypertension is another name for high blood pressure. High blood pressure forces your heart to work harder to pump blood. This can cause problems over time. ?There are two numbers in a blood pressure reading. There is a top number (systolic) over a bottom number (diastolic). It is best to have a blood pressure that is below 120/80. ?What are the causes? ?The cause of this condition is not known. Some other conditions can lead to high blood pressure. ?What increases the risk? ?Some lifestyle factors can make you more likely to develop high blood pressure: ?Smoking. ?Not getting enough exercise or physical activity. ?Being overweight. ?Having too much fat, sugar, calories, or salt (sodium) in your diet. ?Drinking too much alcohol. ?Other risk factors include: ?Having any of these conditions: ?Heart disease. ?Diabetes. ?High cholesterol. ?Kidney disease. ?Obstructive sleep apnea. ?Having a family history of high blood pressure and high cholesterol. ?Age. The risk increases with age. ?Stress. ?What are the signs or symptoms? ?High blood pressure may not cause symptoms. Very high blood pressure (hypertensive crisis) may cause: ?Headache. ?Fast or uneven heartbeats (palpitations). ?Shortness of breath. ?Nosebleed. ?Vomiting or feeling like you may vomit (nauseous). ?Changes in how you see. ?Very bad chest pain. ?Feeling dizzy. ?Seizures. ?How is this treated? ?This condition is treated by making healthy lifestyle changes, such as: ?Eating healthy foods. ?Exercising more. ?Drinking less alcohol. ?Your doctor may prescribe medicine if lifestyle changes do not help enough and if: ?Your top number is above 130. ?Your bottom number is above 80. ?Your personal target blood pressure may vary. ?Follow these instructions at home: ?Eating and drinking ? ?If told, follow the DASH eating plan. To follow this plan: ?Fill one half of your plate at each meal with fruits and vegetables. ?Fill one fourth of your plate  at each meal with whole grains. Whole grains include whole-wheat pasta, brown rice, and whole-grain bread. ?Eat or drink low-fat dairy products, such as skim milk or low-fat yogurt. ?Fill one fourth of your plate at each meal with low-fat (lean) proteins. Low-fat proteins include fish, chicken without skin, eggs, beans, and tofu. ?Avoid fatty meat, cured and processed meat, or chicken with skin. ?Avoid pre-made or processed food. ?Limit the amount of salt in your diet to less than 1,500 mg each day. ?Do not drink alcohol if: ?Your doctor tells you not to drink. ?You are pregnant, may be pregnant, or are planning to become pregnant. ?If you drink alcohol: ?Limit how much you have to: ?0-1 drink a day for women. ?0-2 drinks a day for men. ?Know how much alcohol is in your drink. In the [place] one drink equals one 12 oz bottle of beer (355 mL), one 5 oz glass of wine (148 mL), or one 1? oz glass of hard liquor (44 mL). ?Lifestyle ? ?Work with your doctor to stay at a healthy weight or to lose weight. Ask your doctor what the best weight is for you. ?Get at least 30 minutes of exercise that causes your heart to beat faster (aerobic exercise) most days of the week. This may include walking, swimming, or biking. ?Get at least 30 minutes of exercise that strengthens your muscles (resistance exercise) at least 3 days a week. This may include lifting weights or doing Pilates. ?Do not smoke or use any products that contain nicotine or tobacco. If you need help quitting, ask your doctor. ?Check your blood pressure at home as told by your doctor. ?Keep all follow-up visits. ?Medicines ?Take over-the-counter and prescription medicines   only as told by your doctor. Follow directions carefully. ?Do not skip doses of blood pressure medicine. The medicine does not work as well if you skip doses. Skipping doses also puts you at risk for problems. ?Ask your doctor about side effects or reactions to medicines that you should watch  for. ?Contact a doctor if: ?You think you are having a reaction to the medicine you are taking. ?You have headaches that keep coming back. ?You feel dizzy. ?You have swelling in your ankles. ?You have trouble with your vision. ?Get help right away if: ?You get a very bad headache. ?You start to feel mixed up (confused). ?You feel weak or numb. ?You feel faint. ?You have very bad pain in your: ?Chest. ?Belly (abdomen). ?You vomit more than once. ?You have trouble breathing. ?These symptoms may be an emergency. Get help right away. Call 911. ?Do not wait to see if the symptoms will go away. ?Do not drive yourself to the hospital. ?Summary ?Hypertension is another name for high blood pressure. ?High blood pressure forces your heart to work harder to pump blood. ?For most people, a normal blood pressure is less than 120/80. ?Making healthy choices can help lower blood pressure. If your blood pressure does not get lower with healthy choices, you may need to take medicine. ?This information is not intended to replace advice given to you by your health care provider. Make sure you discuss any questions you have with your health care provider. ?Document Revised: 09/13/2021 Document Reviewed: 09/13/2021 ?Elsevier Patient Education ? 2023 Elsevier Inc[employer] ? ?

## 2022-10-29 LAB — HEMOGLOBIN A1C
Est. average glucose Bld gHb Est-mCnc: 131 mg/dL
Hgb A1c MFr Bld: 6.2 % — ABNORMAL HIGH (ref 4.8–5.6)

## 2022-10-29 LAB — CMP14+EGFR
ALT: 40 IU/L (ref 0–44)
AST: 25 IU/L (ref 0–40)
Albumin/Globulin Ratio: 1.6 (ref 1.2–2.2)
Albumin: 4.3 g/dL (ref 4.1–5.1)
Alkaline Phosphatase: 93 IU/L (ref 44–121)
BUN/Creatinine Ratio: 11 (ref 9–20)
BUN: 10 mg/dL (ref 6–24)
Bilirubin Total: 0.3 mg/dL (ref 0.0–1.2)
CO2: 24 mmol/L (ref 20–29)
Calcium: 9.4 mg/dL (ref 8.7–10.2)
Chloride: 104 mmol/L (ref 96–106)
Creatinine, Ser: 0.91 mg/dL (ref 0.76–1.27)
Globulin, Total: 2.7 g/dL (ref 1.5–4.5)
Glucose: 121 mg/dL — ABNORMAL HIGH (ref 70–99)
Potassium: 4.5 mmol/L (ref 3.5–5.2)
Sodium: 143 mmol/L (ref 134–144)
Total Protein: 7 g/dL (ref 6.0–8.5)
eGFR: 104 mL/min/{1.73_m2} (ref >59)

## 2022-11-05 ENCOUNTER — Ambulatory Visit (INDEPENDENT_AMBULATORY_CARE_PROVIDER_SITE_OTHER): Payer: BC Managed Care – PPO

## 2022-11-05 ENCOUNTER — Ambulatory Visit (INDEPENDENT_AMBULATORY_CARE_PROVIDER_SITE_OTHER): Payer: BC Managed Care – PPO | Admitting: Orthopaedic Surgery

## 2022-11-05 ENCOUNTER — Encounter: Payer: Self-pay | Admitting: Orthopaedic Surgery

## 2022-11-05 ENCOUNTER — Ambulatory Visit: Payer: Self-pay

## 2022-11-05 VITALS — Ht 70.0 in | Wt 398.0 lb

## 2022-11-05 DIAGNOSIS — Z6841 Body Mass Index (BMI) 40.0 and over, adult: Secondary | ICD-10-CM

## 2022-11-05 DIAGNOSIS — M25561 Pain in right knee: Secondary | ICD-10-CM

## 2022-11-05 DIAGNOSIS — M25562 Pain in left knee: Secondary | ICD-10-CM | POA: Diagnosis not present

## 2022-11-05 DIAGNOSIS — G8929 Other chronic pain: Secondary | ICD-10-CM

## 2022-11-05 MED ORDER — METHYLPREDNISOLONE ACETATE 40 MG/ML IJ SUSP
40.0000 mg | INTRAMUSCULAR | Status: AC | PRN
  Administered 2022-11-05: 40 mg via INTRA_ARTICULAR

## 2022-11-05 MED ORDER — LIDOCAINE HCL 1 % IJ SOLN
2.0000 mL | INTRAMUSCULAR | Status: AC | PRN
  Administered 2022-11-05: 2 mL

## 2022-11-05 MED ORDER — BUPIVACAINE HCL 0.5 % IJ SOLN
2.0000 mL | INTRAMUSCULAR | Status: AC | PRN
  Administered 2022-11-05: 2 mL via INTRA_ARTICULAR

## 2022-11-05 NOTE — Progress Notes (Signed)
Office Visit Note   Patient: [name] Gordon           Date of Birth: 01-11-1976           MRN: [id number] Visit Date: 11/05/2022              Requested by: Glendale Chard, [address] [place],  [place] [address] PCP: Glendale Chard, MD   Assessment & Plan: Visit Diagnoses:  1. Chronic pain of both knees   2. Body mass index 50.0-59.9, adult ([place])   3. Morbid obesity ([place])     Plan: [name] Gordon unfortunately suffers from severe tricompartmental osteoarthritis with varus deformity.  The tibia is subluxing laterally.  These findings were reviewed with the patient in detail and treatment options were explained.  Given the severity of the degenerative joint disease I explained that the knee replacement is his best option for meaningful and long-term pain relief.  However his BMI is 57 currently and he understands it needs to be 40 or less before he is a surgical candidate.  He is otherwise fairly healthy.  His goal weight is around 280 pounds.  He did like to undergo bilateral cortisone injections today.  I have given him my card.  He will follow-up with [place] as needed.  Follow-Up Instructions: No follow-ups on file.   Orders:  Orders Placed This Encounter  Procedures   XR KNEE 3 VIEW RIGHT   XR KNEE 3 VIEW LEFT   No orders of the defined types were placed in this encounter.     Procedures: Large Joint Inj: bilateral knee on 11/05/2022 9:53 AM Indications: pain Details: 22 G needle  Arthrogram: No  Medications (Right): 2 mL lidocaine 1 %; 2 mL bupivacaine 0.5 %; 40 mg methylPREDNISolone acetate 40 MG/ML Medications (Left): 2 mL lidocaine 1 %; 2 mL bupivacaine 0.5 %; 40 mg methylPREDNISolone acetate 40 MG/ML Outcome: tolerated well, no immediate complications Patient was prepped and draped in the usual sterile fashion.       Clinical Data: No additional findings.   Subjective: Chief Complaint  Patient presents with   Right Knee - Pain   Left Knee -  Pain    HPI [name] Gordon is a very pleasant 46 year old gentleman here to see me for chronic severe bilateral knee pain for years.  He is status post left knee arthroscopy twice many years ago by Dr. Lorre Nick.  Currently works as a Games developer.  He takes occasional oxycodone for breakthrough pain.  Pain is worse with standing and activity.  Review of Systems  Constitutional: Negative.   HENT: Negative.    Eyes: Negative.   Respiratory: Negative.    Cardiovascular: Negative.   Gastrointestinal: Negative.   Endocrine: Negative.   Genitourinary: Negative.   Skin: Negative.   Allergic/Immunologic: Negative.   Neurological: Negative.   Hematological: Negative.   Psychiatric/Behavioral: Negative.    All other systems reviewed and are negative.    Objective: Vital Signs: Ht '5\' 10"'$  (1.778 m)   Wt (!) 398 lb (180.5 kg)   BMI 57.11 kg/m   Physical Exam Vitals and nursing note reviewed.  Constitutional:      Appearance: He is well-developed.  HENT:     Head: Normocephalic and atraumatic.  Eyes:     Pupils: Pupils are equal, round, and reactive to light.  Pulmonary:     Effort: Pulmonary effort is normal.  Abdominal:     Palpations: Abdomen is soft.  Musculoskeletal:  General: Normal range of motion.     Cervical back: Neck supple.  Skin:    General: Skin is warm.  Neurological:     Mental Status: He is alert and oriented to person, place, and time.  Psychiatric:        Behavior: Behavior normal.        Thought Content: Thought content normal.        Judgment: Judgment normal.     Ortho Exam Examination of bilateral knees show varus deformity.  Medial joint line tenderness.  2+ crepitus with range of motion with pain. Specialty Comments:  No specialty comments available.  Imaging: No results found.   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   RLS (restless legs syndrome) 02/12/2021   Essential hypertension, benign 08/08/2020   Class 3 severe obesity due to excess calories with serious comorbidity and body mass index (BMI) of 50.0 to 59.9 in adult [facility]) 01/04/2020   Onychomycosis 10/25/2019   Past Medical History:  Diagnosis Date   Fatty liver 2017   Mild   History of gout    Hypertension    Morbid obesity (HCC)    OA (osteoarthritis) of knee    Spinal stenosis 2018    Family History  Problem Relation Age of Onset   Hypertension Mother    Diabetes Mother    Cancer Father     Past Surgical History:  Procedure Laterality Date   ARTHROSCOPY KNEE W/ DRILLING     COLONOSCOPY WITH PROPOFOL N/A 06/01/2019   Procedure: COLONOSCOPY WITH PROPOFOL;  Surgeon: Carol Ada, MD;  Location: [facility];  Service: Endoscopy;  Laterality: N/A;   POLYPECTOMY  06/01/2019   Procedure: POLYPECTOMY;  Surgeon: Carol Ada, MD;  Location: [facility];  Service: Endoscopy;;   Social History   Occupational History   Not on file  Tobacco Use   Smoking status: Never   Smokeless tobacco: Never  Vaping Use   Vaping Use: Never used  Substance and Sexual Activity   Alcohol use: No   Drug use: No   Sexual activity: Yes    Birth control/protection: None

## 2022-11-05 NOTE — Addendum Note (Signed)
Addended by: Lendon Collar on: 11/05/2022 12:47 PM   Modules accepted: Orders

## 2022-11-06 ENCOUNTER — Encounter (INDEPENDENT_AMBULATORY_CARE_PROVIDER_SITE_OTHER): Payer: Self-pay

## 2022-11-13 ENCOUNTER — Other Ambulatory Visit (HOSPITAL_COMMUNITY): Payer: Self-pay

## 2022-11-18 ENCOUNTER — Telehealth (INDEPENDENT_AMBULATORY_CARE_PROVIDER_SITE_OTHER): Payer: Self-pay | Admitting: Internal Medicine

## 2022-11-18 ENCOUNTER — Encounter: Payer: Self-pay | Admitting: Internal Medicine

## 2022-11-18 ENCOUNTER — Other Ambulatory Visit: Payer: Self-pay | Admitting: Internal Medicine

## 2022-11-18 DIAGNOSIS — J0100 Acute maxillary sinusitis, unspecified: Secondary | ICD-10-CM

## 2022-11-18 DIAGNOSIS — I10 Essential (primary) hypertension: Secondary | ICD-10-CM

## 2022-11-18 MED ORDER — AMOXICILLIN-POT CLAVULANATE 875-125 MG PO TABS: 1.0000 | ORAL_TABLET | Freq: Two times a day (BID) | ORAL | 0 refills | Status: DC

## 2022-11-18 MED ORDER — HYDROCODONE BIT-HOMATROP MBR 5-1.5 MG/5ML PO SOLN: 5.0000 mL | Freq: Four times a day (QID) | ORAL | 0 refills | Status: DC | PRN

## 2022-11-18 NOTE — Progress Notes (Addendum)
Virtual Visit via Video   This visit type was conducted due to national recommendations for restrictions regarding the COVID-19 Pandemic (e.g. social distancing) in an effort to limit this patient's exposure and mitigate transmission in our community.  Due to his co-morbid illnesses, this patient is at least at moderate risk for complications without adequate follow up.  This format is felt to be most appropriate for this patient at this time.  All issues noted in this document were discussed and addressed.  A limited physical exam was performed with this format.    This visit type was conducted due to national recommendations for restrictions regarding the COVID-19 Pandemic (e.g. social distancing) in an effort to limit this patient's exposure and mitigate transmission in our community.  Patients identity confirmed using two different identifiers.  This format is felt to be most appropriate for this patient at this time.  All issues noted in this document were discussed and addressed.  No physical exam was performed (except for noted visual exam findings with Video Visits).    Date:  11/18/2022   ID:  [name] Gordon, DOB 1976-10-20, MRN [id number]  Patient Location:  Home  Provider location:   Office  Chief Complaint:  "I am sick and need some meds"  History of Present Illness:    [name] Gordon is a 46 y.o. male who presents via video conferencing for a telehealth visit today.    The patient does have symptoms concerning for COVID-19 infection (fever, chills, cough, or new shortness of breath).   He presents today for virtual visit. He prefers this method of contact due to COVID-19 pandemic.  He presents today for further evaluation of sinus congestion, cough and sore throat. No relief with OTC meds. Sx started last week. Wife recently sick with URI as well.        Past Medical History:  Diagnosis Date   Fatty liver 2017   Mild   History of gout    Hypertension    Morbid  obesity ([place])    OA (osteoarthritis) of knee    Spinal stenosis 2018   Past Surgical History:  Procedure Laterality Date   ARTHROSCOPY KNEE W/ DRILLING     COLONOSCOPY WITH PROPOFOL N/A 06/01/2019   Procedure: COLONOSCOPY WITH PROPOFOL;  Surgeon: Carol Ada, MD;  Location: [facility];  Service: Endoscopy;  Laterality: N/A;   POLYPECTOMY  06/01/2019   Procedure: POLYPECTOMY;  Surgeon: Carol Ada, MD;  Location: [facility];  Service: Endoscopy;;     Current Meds  Medication Sig   amoxicillin-clavulanate (AUGMENTIN) 875-125 MG tablet Take 1 tablet by mouth 2 (two) times daily.   HYDROcodone bit-homatropine (HYDROMET) 5-1.5 MG/5ML syrup Take 5 mLs by mouth every 6 (six) hours as needed.     Allergies:   Patient has no known allergies.   Social History   Tobacco Use   Smoking status: Never   Smokeless tobacco: Never  Vaping Use   Vaping Use: Never used  Substance Use Topics   Alcohol use: No   Drug use: No     Family Hx: The patient's family history includes Cancer in his father; Diabetes in his mother; Hypertension in his mother.  ROS:   Please see the history of present illness.    Review of Systems  Constitutional:  Positive for malaise/fatigue.  HENT:  Positive for congestion and sore throat.   Respiratory: Negative.    Cardiovascular: Negative.   Gastrointestinal: Negative.   Neurological: Negative.   Psychiatric/Behavioral: Negative.  All other systems reviewed and are negative.   Labs/Other Tests and Data Reviewed:    Recent Labs: 04/25/2022: Hemoglobin 13.8; Platelets 291 10/28/2022: ALT 40; BUN 10; Creatinine, Ser 0.91; Potassium 4.5; Sodium 143   Recent Lipid Panel Lab Results  Component Value Date/Time   CHOL 159 04/25/2022 12:48 PM   TRIG 68 04/25/2022 12:48 PM   HDL 50 04/25/2022 12:48 PM   CHOLHDL 3.2 04/25/2022 12:48 PM   LDLCALC 96 04/25/2022 12:48 PM    Wt Readings from Last 3 Encounters:  11/05/22 (!) 398 lb (180.5 kg)   10/28/22 (!) 397 lb 6.4 oz (180.3 kg)  05/28/22 (!) 384 lb (174.2 kg)     Exam:    Vital Signs:  There were no vitals taken for this visit.    Physical Exam Vitals and nursing note reviewed.  HENT:     Head: Normocephalic and atraumatic.  Eyes:     Extraocular Movements: Extraocular movements intact.  Pulmonary:     Effort: Pulmonary effort is normal.  Musculoskeletal:     Cervical back: Normal range of motion.  Neurological:     Mental Status: He is alert and oriented to person, place, and time.  Psychiatric:        Mood and Affect: Affect normal.     ASSESSMENT & PLAN:    1. Acute non-recurrent maxillary sinusitis Comments: I will send rx Augmentin to take twice daily and hydromet. Advised to avoid dairy and stay well hydrated. Will consider COVID testing if sx persist.  2. Essential hypertension, benign Comments: Chronic, advised to avoid OTC decongestants which can raise his blood pressure.    COVID-19 Education: The signs and symptoms of COVID-19 were discussed with the patient and how to seek care for testing (follow up with PCP or arrange E-visit).  The importance of social distancing was discussed today.  Patient Risk:   After full review of this patients clinical status, I feel that they are at least moderate risk at this time.  Time:   Today, I have spent 9 minutes/ seconds with the patient with telehealth technology discussing above diagnoses.     Medication Adjustments/Labs and Tests Ordered: Current medicines are reviewed at length with the patient today.  Concerns regarding medicines are outlined above.   Tests Ordered: No orders of the defined types were placed in this encounter.   Medication Changes: Meds ordered this encounter  Medications   amoxicillin-clavulanate (AUGMENTIN) 875-125 MG tablet    Sig: Take 1 tablet by mouth 2 (two) times daily.    Dispense:  20 tablet    Refill:  0   HYDROcodone bit-homatropine (HYDROMET) 5-1.5 MG/5ML  syrup    Sig: Take 5 mLs by mouth every 6 (six) hours as needed.    Dispense:  120 mL    Refill:  0    Disposition:  Follow up prn  Signed, [name], MD

## 2022-11-18 NOTE — Patient Instructions (Signed)

## 2022-11-19 ENCOUNTER — Encounter: Payer: Self-pay | Admitting: Internal Medicine

## 2022-11-20 ENCOUNTER — Encounter: Payer: Self-pay | Admitting: Urology

## 2022-11-23 ENCOUNTER — Ambulatory Visit
Admission: RE | Admit: 2022-11-23 | Discharge: 2022-11-23 | Disposition: A | Discharge status: Home / Self Care | Payer: BC Managed Care – PPO | Source: Ambulatory Visit | Attending: Urology | Admitting: Urology

## 2022-11-23 MED ORDER — GADOPICLENOL 0.5 MMOL/ML IV SOLN
10.0000 mL | Freq: Once | INTRAVENOUS | Status: AC | PRN
  Administered 2022-11-23: 10 mL via INTRAVENOUS

## 2022-12-04 ENCOUNTER — Other Ambulatory Visit (HOSPITAL_COMMUNITY): Payer: Self-pay

## 2022-12-06 ENCOUNTER — Other Ambulatory Visit (HOSPITAL_COMMUNITY): Payer: Self-pay

## 2022-12-06 ENCOUNTER — Other Ambulatory Visit: Payer: Self-pay

## 2022-12-10 ENCOUNTER — Other Ambulatory Visit (HOSPITAL_COMMUNITY): Payer: Self-pay

## 2022-12-10 ENCOUNTER — Encounter: Payer: Self-pay | Admitting: Internal Medicine

## 2022-12-11 ENCOUNTER — Other Ambulatory Visit: Payer: Self-pay

## 2022-12-11 MED ORDER — ZEPBOUND 5 MG/0.5ML ~~LOC~~ SOAJ: 5.0000 mg | SUBCUTANEOUS | 0 refills | Status: DC

## 2022-12-16 ENCOUNTER — Encounter: Payer: Self-pay | Admitting: Internal Medicine

## 2022-12-16 ENCOUNTER — Ambulatory Visit (INDEPENDENT_AMBULATORY_CARE_PROVIDER_SITE_OTHER): Payer: BC Managed Care – PPO | Admitting: Internal Medicine

## 2022-12-16 VITALS — BP 114/80 | HR 77 | Temp 98.6°F | Ht 70.0 in | Wt 384.6 lb

## 2022-12-16 DIAGNOSIS — I10 Essential (primary) hypertension: Secondary | ICD-10-CM

## 2022-12-16 DIAGNOSIS — Z6841 Body Mass Index (BMI) 40.0 and over, adult: Secondary | ICD-10-CM | POA: Diagnosis not present

## 2022-12-16 NOTE — Progress Notes (Signed)
[name],acting as a Education administrator for [name], MD.,have documented all relevant documentation on the behalf of [name], MD,as directed by  [name], MD while in the presence of [name], MD.    Subjective:     Patient ID: [name] Gordon , male    DOB: 05/06/1976 , 47 y.o.   MRN: [id number]   Chief Complaint  Patient presents with   Weight Check    HPI  Patient presents today for a weight check.  Patient reports he has been taking semaglutide 0.'5mg'$  weekly without any complications. He is tolerating it well. He denies having abdominal discomfort, constipation has resolved.      Past Medical History:  Diagnosis Date   Fatty liver 2017   Mild   History of gout    Hypertension    Morbid obesity (HCC)    OA (osteoarthritis) of knee    Spinal stenosis 2018     Family History  Problem Relation Age of Onset   Hypertension Mother    Diabetes Mother    Cancer Father      Current Outpatient Medications:    amlodipine-olmesartan (AZOR) 10-20 MG tablet, Take 1 tablet by mouth daily., Disp: 90 tablet, Rfl: 1   cholecalciferol (VITAMIN D3) 25 MCG (1000 UT) tablet, Take 1,000 Units by mouth daily., Disp: , Rfl:    colchicine 0.6 MG tablet, Take 2 tablets by mouth at the onset of gout pain and then 1 tablet in 1 hour. Do not take more than 3 tablets per gout flare up., Disp: 30 tablet, Rfl: 0   montelukast (SINGULAIR) 10 MG tablet, TAKE 1 TABLET BY MOUTH EVERY DAY, Disp: 90 tablet, Rfl: 1   nebivolol (BYSTOLIC) 10 MG tablet, Take 1 tablet (10 mg total) by mouth daily., Disp: 90 tablet, Rfl: 1   oxyCODONE-acetaminophen (PERCOCET) 5-325 MG tablet, Take 1 tablet by mouth every 8 (eight) hours as needed for severe pain., Disp: 30 tablet, Rfl: 0   tirzepatide (ZEPBOUND) 5 MG/0.5ML Pen, Inject 5 mg into the skin once a week., Disp: 2 mL, Rfl: 0   Semaglutide-Weight Management (WEGOVY) 1 MG/0.5ML SOAJ, Inject 1 mg into the skin once a week. (Patient not  taking: Reported on 12/16/2022), Disp: 2 mL, Rfl: 1   No Known Allergies   Review of Systems  Constitutional: Negative.   Eyes: Negative.   Respiratory: Negative.    Cardiovascular: Negative.   Gastrointestinal: Negative.   Musculoskeletal: Negative.   Skin: Negative.   Neurological: Negative.   Psychiatric/Behavioral: Negative.       Today's Vitals   12/16/22 0915  BP: 114/80  Pulse: 77  Temp: 98.6 F (37 C)  Weight: (!) 384 lb 9.6 oz (174.5 kg)  Height: '5\' 10"'$  (1.778 m)  PainSc: 0-No pain   Body mass index is 55.18 kg/m.  Wt Readings from Last 3 Encounters:  12/16/22 (!) 384 lb 9.6 oz (174.5 kg)  11/05/22 (!) 398 lb (180.5 kg)  10/28/22 (!) 397 lb 6.4 oz (180.3 kg)     Objective:  Physical Exam Vitals and nursing note reviewed.  Constitutional:      Appearance: Normal appearance.  HENT:     Head: Normocephalic and atraumatic.     Nose:     Comments: Masked     Mouth/Throat:     Comments: Masked  Eyes:     Extraocular Movements: Extraocular movements intact.  Cardiovascular:     Rate and Rhythm: Normal rate and regular rhythm.  Heart sounds: Normal heart sounds.  Pulmonary:     Effort: Pulmonary effort is normal.     Breath sounds: Normal breath sounds.  Musculoskeletal:     Cervical back: Normal range of motion.  Skin:    General: Skin is warm.  Neurological:     General: No focal deficit present.     Mental Status: He is alert.  Psychiatric:        Mood and Affect: Mood normal.       Assessment And Plan:     1. Class 3 severe obesity due to excess calories with serious comorbidity and body mass index (BMI) of 50.0 to 59.9 in adult [facility]) Comments: He was congratulated on his 14lb weight loss, insurance prefers Zepbound. Will start PA.  He will f/u in 8 weeks.  2. Essential hypertension, benign Comments: Chronic, well controlled. NO med changes.    Patient was given opportunity to ask questions. Patient verbalized understanding of the plan  and was able to repeat key elements of the plan. All questions were answered to their satisfaction.   I, [name], MD, have reviewed all documentation for this visit. The documentation on 12/16/22 for the exam, diagnosis, procedures, and orders are all accurate and complete. 9  IF YOU HAVE BEEN REFERRED TO A SPECIALIST, IT MAY TAKE 1-2 WEEKS TO SCHEDULE/PROCESS THE REFERRAL. IF YOU HAVE NOT HEARD FROM US/SPECIALIST IN TWO WEEKS, PLEASE GIVE [place] A CALL AT [phone].   THE PATIENT IS ENCOURAGED TO PRACTICE SOCIAL DISTANCING DUE TO THE COVID-19 PANDEMIC.

## 2022-12-16 NOTE — Patient Instructions (Signed)

## 2022-12-26 ENCOUNTER — Other Ambulatory Visit: Payer: Self-pay

## 2022-12-26 ENCOUNTER — Other Ambulatory Visit (HOSPITAL_COMMUNITY): Payer: Self-pay

## 2022-12-26 MED ORDER — WEGOVY 1.7 MG/0.75ML ~~LOC~~ SOAJ
1.7000 mg | SUBCUTANEOUS | 0 refills | Status: DC
  Filled 2022-12-26: qty 3, 28d supply, fill #0

## 2023-01-09 ENCOUNTER — Other Ambulatory Visit (HOSPITAL_COMMUNITY): Payer: Self-pay

## 2023-02-24 ENCOUNTER — Encounter: Payer: Self-pay | Admitting: Internal Medicine

## 2023-02-24 ENCOUNTER — Other Ambulatory Visit: Payer: Self-pay | Admitting: Internal Medicine

## 2023-02-24 ENCOUNTER — Ambulatory Visit: Payer: BC Managed Care – PPO | Admitting: Internal Medicine

## 2023-02-24 VITALS — BP 134/80 | HR 82 | Temp 98.2°F | Ht 70.0 in | Wt 385.4 lb

## 2023-02-24 DIAGNOSIS — R7309 Other abnormal glucose: Secondary | ICD-10-CM | POA: Diagnosis not present

## 2023-02-24 DIAGNOSIS — I10 Essential (primary) hypertension: Secondary | ICD-10-CM | POA: Diagnosis not present

## 2023-02-24 DIAGNOSIS — Z6841 Body Mass Index (BMI) 40.0 and over, adult: Secondary | ICD-10-CM

## 2023-02-24 MED ORDER — ZEPBOUND 2.5 MG/0.5ML ~~LOC~~ SOAJ: 2.5000 mg | SUBCUTANEOUS | 0 refills | Status: DC

## 2023-02-24 NOTE — Patient Instructions (Signed)

## 2023-02-24 NOTE — Progress Notes (Signed)
I,Victoria T Hamilton,acting as a scribe for [name], MD.,have documented all relevant documentation on the behalf of [name], MD,as directed by  [name], MD while in the presence of [name], MD.    Subjective:     Patient ID: [name] Gordon , male    DOB: [birth year] , 47 y.o.   MRN: [id number]   Chief Complaint  Patient presents with   Weight Check   Hypertension    HPI  Patient presents today for a weight check.  Patient reports he has been without the [facility] since previously seen in January. He currently reports being stressed. He was told information that put him on edge before coming into appointment today.   Hypertension This is a chronic problem. The current episode started more than 1 year ago. The problem is unchanged. The problem is controlled. Pertinent negatives include no anxiety. There are no associated agents to hypertension. Risk factors for coronary artery disease include obesity, sedentary lifestyle and male gender. Past treatments include angiotensin blockers, calcium channel blockers and lifestyle changes. There are no compliance problems.      Past Medical History:  Diagnosis Date   Fatty liver 2017   Mild   History of gout    Hypertension    Morbid obesity (HCC)    OA (osteoarthritis) of knee    Spinal stenosis 2018     Family History  Problem Relation Age of Onset   Hypertension Mother    Diabetes Mother    Cancer Father      Current Outpatient Medications:    amlodipine-olmesartan (AZOR) 10-20 MG tablet, Take 1 tablet by mouth daily., Disp: 90 tablet, Rfl: 1   cholecalciferol (VITAMIN D3) 25 MCG (1000 UT) tablet, Take 1,000 Units by mouth daily., Disp: , Rfl:    colchicine 0.6 MG tablet, Take 2 tablets by mouth at the onset of gout pain and then 1 tablet in 1 hour. Do not take more than 3 tablets per gout flare up., Disp: 30 tablet, Rfl: 0   montelukast (SINGULAIR) 10 MG tablet, TAKE 1 TABLET BY MOUTH EVERY DAY,  Disp: 90 tablet, Rfl: 1   nebivolol (BYSTOLIC) 10 MG tablet, Take 1 tablet (10 mg total) by mouth daily., Disp: 90 tablet, Rfl: 1   oxyCODONE-acetaminophen (PERCOCET) 5-325 MG tablet, Take 1 tablet by mouth every 8 (eight) hours as needed for severe pain., Disp: 30 tablet, Rfl: 0   tirzepatide (ZEPBOUND) 2.5 MG/0.5ML Pen, Inject 2.5 mg into the skin once a week., Disp: 2 mL, Rfl: 0   No Known Allergies   Review of Systems  Constitutional: Negative.   HENT: Negative.    Cardiovascular: Negative.   Gastrointestinal: Negative.   Endocrine: Negative.   Genitourinary: Negative.   Skin: Negative.   Allergic/Immunologic: Negative.   Hematological: Negative.      Today's Vitals   02/24/23 0903  BP: 134/80  Pulse: 82  Temp: 98.2 F (36.8 C)  SpO2: 98%  Weight: (!) 385 lb 6.4 oz (174.8 kg)  Height: 5\' 10"  (1.778 m)   Body mass index is 55.3 kg/m.  Wt Readings from Last 3 Encounters:  02/24/23 (!) 385 lb 6.4 oz (174.8 kg)  12/16/22 (!) 384 lb 9.6 oz (174.5 kg)  11/05/22 (!) 398 lb (180.5 kg)    Objective:  Physical Exam Vitals and nursing note reviewed.  Constitutional:      Appearance: Normal appearance.  HENT:     Head: Normocephalic and atraumatic.  Nose:     Comments: Masked     Mouth/Throat:     Comments: Masked  Eyes:     Extraocular Movements: Extraocular movements intact.  Cardiovascular:     Rate and Rhythm: Normal rate and regular rhythm.     Heart sounds: Normal heart sounds.  Pulmonary:     Effort: Pulmonary effort is normal.     Breath sounds: Normal breath sounds.  Musculoskeletal:     Cervical back: Normal range of motion.  Skin:    General: Skin is warm.  Neurological:     General: No focal deficit present.     Mental Status: He is alert.  Psychiatric:        Mood and Affect: Mood normal.      Assessment And Plan:     1. Essential hypertension, benign Comments: Chronic, fair control. Goal BP<130/80. He will c/w Azor 10/20 and nebivolol 10mg   daily.  He is encouraged to follow low sodium diet. - BMP8+EGFR  2. Other abnormal glucose Comments: Previous labs reviewed, a1c has been elevated in the past. I will recheck an a1c today. He is encouraged to decrease intake of sugary foods/beverages. - Hemoglobin A1c - BMP8+EGFR  3. Class 3 severe obesity due to excess calories with serious comorbidity and body mass index (BMI) of 50.0 to 59.9 in adult [facility]) Comments: BMI 55.  I will send rx Zepbound. He denies family h/o thyroid cancer. He agrees to return if approved to learn how to self administer.  Patient was given opportunity to ask questions. Patient verbalized understanding of the plan and was able to repeat key elements of the plan. All questions were answered to their satisfaction.   I, [name], MD, have reviewed all documentation for this visit. The documentation on 02/24/23 for the exam, diagnosis, procedures, and orders are all accurate and complete.   IF YOU HAVE BEEN REFERRED TO A SPECIALIST, IT MAY TAKE 1-2 WEEKS TO SCHEDULE/PROCESS THE REFERRAL. IF YOU HAVE NOT HEARD FROM US/SPECIALIST IN TWO WEEKS, PLEASE GIVE [place] A CALL AT [phone].   THE PATIENT IS ENCOURAGED TO PRACTICE SOCIAL DISTANCING DUE TO THE COVID-19 PANDEMIC.

## 2023-02-24 NOTE — Telephone Encounter (Signed)
Pharmacy comment: Alternative Requested:NOT COVERED.

## 2023-02-25 LAB — HEMOGLOBIN A1C
Est. average glucose Bld gHb Est-mCnc: 126 mg/dL
Hgb A1c MFr Bld: 6 % — ABNORMAL HIGH (ref 4.8–5.6)

## 2023-02-25 LAB — BMP8+EGFR
BUN/Creatinine Ratio: 10 (ref 9–20)
BUN: 11 mg/dL (ref 6–24)
CO2: 23 mmol/L (ref 20–29)
Calcium: 9.6 mg/dL (ref 8.7–10.2)
Chloride: 105 mmol/L (ref 96–106)
Creatinine, Ser: 1.08 mg/dL (ref 0.76–1.27)
Glucose: 171 mg/dL — ABNORMAL HIGH (ref 70–99)
Potassium: 5 mmol/L (ref 3.5–5.2)
Sodium: 144 mmol/L (ref 134–144)
eGFR: 85 mL/min/{1.73_m2} (ref >59)

## 2023-03-28 ENCOUNTER — Other Ambulatory Visit: Payer: Self-pay | Admitting: Orthopedic Surgery

## 2023-04-29 ENCOUNTER — Ambulatory Visit (INDEPENDENT_AMBULATORY_CARE_PROVIDER_SITE_OTHER): Payer: BC Managed Care – PPO | Admitting: Internal Medicine

## 2023-04-29 ENCOUNTER — Encounter: Payer: Self-pay | Admitting: Internal Medicine

## 2023-04-29 VITALS — BP 126/70 | HR 81 | Temp 98.3°F | Ht 67.8 in | Wt 395.4 lb

## 2023-04-29 DIAGNOSIS — R7309 Other abnormal glucose: Secondary | ICD-10-CM

## 2023-04-29 DIAGNOSIS — F4321 Adjustment disorder with depressed mood: Secondary | ICD-10-CM

## 2023-04-29 DIAGNOSIS — M170 Bilateral primary osteoarthritis of knee: Secondary | ICD-10-CM | POA: Diagnosis not present

## 2023-04-29 DIAGNOSIS — Z0000 Encounter for general adult medical examination without abnormal findings: Secondary | ICD-10-CM | POA: Diagnosis not present

## 2023-04-29 DIAGNOSIS — I10 Essential (primary) hypertension: Secondary | ICD-10-CM | POA: Diagnosis not present

## 2023-04-29 DIAGNOSIS — Z23 Encounter for immunization: Secondary | ICD-10-CM

## 2023-04-29 DIAGNOSIS — N414 Granulomatous prostatitis: Secondary | ICD-10-CM | POA: Diagnosis not present

## 2023-04-29 DIAGNOSIS — Z6841 Body Mass Index (BMI) 40.0 and over, adult: Secondary | ICD-10-CM

## 2023-04-29 DIAGNOSIS — Z111 Encounter for screening for respiratory tuberculosis: Secondary | ICD-10-CM

## 2023-04-29 LAB — POCT URINALYSIS DIPSTICK
Bilirubin, UA: NEGATIVE
Blood, UA: NEGATIVE
Glucose, UA: NEGATIVE
Ketones, UA: NEGATIVE
Leukocytes, UA: NEGATIVE
Nitrite, UA: NEGATIVE
Protein, UA: NEGATIVE
Spec Grav, UA: >=1.03 — AB (ref 1.010–1.025)
Urobilinogen, UA: 0.2 E.U./dL
pH, UA: 7 (ref 5.0–8.0)

## 2023-04-29 MED ORDER — OXYCODONE-ACETAMINOPHEN 5-325 MG PO TABS: 1.0000 | ORAL_TABLET | Freq: Three times a day (TID) | ORAL | 0 refills | Status: DC | PRN

## 2023-04-29 NOTE — Patient Instructions (Signed)
Health Maintenance, Male Adopting a healthy lifestyle and getting preventive care are important in promoting health and wellness. Ask your health care provider about: The right schedule for you to have regular tests and exams. Things you can do on your own to prevent diseases and keep yourself healthy. What should I know about diet, weight, and exercise? Eat a healthy diet  Eat a diet that includes plenty of vegetables, fruits, low-fat dairy products, and lean protein. Do not eat a lot of foods that are high in solid fats, added sugars, or sodium. Maintain a healthy weight Body mass index (BMI) is a measurement that can be used to identify possible weight problems. It estimates body fat based on height and weight. Your health care provider can help determine your BMI and help you achieve or maintain a healthy weight. Get regular exercise Get regular exercise. This is one of the most important things you can do for your health. Most adults should: Exercise for at least 150 minutes each week. The exercise should increase your heart rate and make you sweat (moderate-intensity exercise). Do strengthening exercises at least twice a week. This is in addition to the moderate-intensity exercise. Spend less time sitting. Even light physical activity can be beneficial. Watch cholesterol and blood lipids Have your blood tested for lipids and cholesterol at 49 years of age, then have this test every 5 years. You may need to have your cholesterol levels checked more often if: Your lipid or cholesterol levels are high. You are older than 49 years of age. You are at high risk for heart disease. What should I know about cancer screening? Many types of cancers can be detected early and may often be prevented. Depending on your health history and family history, you may need to have cancer screening at various ages. This may include screening for: Colorectal cancer. Prostate cancer. Skin cancer. Lung  cancer. What should I know about heart disease, diabetes, and high blood pressure? Blood pressure and heart disease High blood pressure causes heart disease and increases the risk of stroke. This is more likely to develop in people who have high blood pressure readings or are overweight. Talk with your health care provider about your target blood pressure readings. Have your blood pressure checked: Every 3-5 years if you are 18-39 years of age. Every year if you are 40 years old or older. If you are between the ages of 65 and 75 and are a current or former smoker, ask your health care provider if you should have a one-time screening for abdominal aortic aneurysm (AAA). Diabetes Have regular diabetes screenings. This checks your fasting blood sugar level. Have the screening done: Once every three years after age 45 if you are at a normal weight and have a low risk for diabetes. More often and at a younger age if you are overweight or have a high risk for diabetes. What should I know about preventing infection? Hepatitis B If you have a higher risk for hepatitis B, you should be screened for this virus. Talk with your health care provider to find out if you are at risk for hepatitis B infection. Hepatitis C Blood testing is recommended for: Everyone born from 1945 through 1965. Anyone with known risk factors for hepatitis C. Sexually transmitted infections (STIs) You should be screened each year for STIs, including gonorrhea and chlamydia, if: You are sexually active and are younger than 49 years of age. You are older than 49 years of age and your   health care provider tells you that you are at risk for this type of infection. Your sexual activity has changed since you were last screened, and you are at increased risk for chlamydia or gonorrhea. Ask your health care provider if you are at risk. Ask your health care provider about whether you are at high risk for HIV. Your health care provider  may recommend a prescription medicine to help prevent HIV infection. If you choose to take medicine to prevent HIV, you should first get tested for HIV. You should then be tested every 3 months for as long as you are taking the medicine. Follow these instructions at home: Alcohol use Do not drink alcohol if your health care provider tells you not to drink. If you drink alcohol: Limit how much you have to 0-2 drinks a day. Know how much alcohol is in your drink. In the [place] one drink equals one 12 oz bottle of beer (355 mL), one 5 oz glass of wine (148 mL), or one 1 oz glass of hard liquor (44 mL). Lifestyle Do not use any products that contain nicotine or tobacco. These products include cigarettes, chewing tobacco, and vaping devices, such as e-cigarettes. If you need help quitting, ask your health care provider. Do not use street drugs. Do not share needles. Ask your health care provider for help if you need support or information about quitting drugs. General instructions Schedule regular health, dental, and eye exams. Stay current with your vaccines. Tell your health care provider if: You often feel depressed. You have ever been abused or do not feel safe at home. Summary Adopting a healthy lifestyle and getting preventive care are important in promoting health and wellness. Follow your health care provider's instructions about healthy diet, exercising, and getting tested or screened for diseases. Follow your health care provider's instructions on monitoring your cholesterol and blood pressure. This information is not intended to replace advice given to you by your health care provider. Make sure you discuss any questions you have with your health care provider. Document Revised: 04/16/2021 Document Reviewed: 04/16/2021 Elsevier Patient Education  2023 [employer] Inc.  

## 2023-04-29 NOTE — Progress Notes (Addendum)
Anesthesia Review:  PCP: Dorothyann Peng LOV 04/29/23 - Physical  Cardiologist : none  Chest x-ray : EKG : 04/29/23  Echo : Stress test: Cardiac Cath :  Activity level: can do a flight of stairs without difficutly  Sleep Study/ CPAP : none - was supposed to have sleep study and never did  Fasting Blood Sugar :      / Checks Blood Sugar -- times a day:   Blood Thinner/ Instructions /Last Dose: ASA / Instructions/ Last Dose :    PRediabetes- does not check glucose at home on no meds  Hgba1c- 05/06/23- 6.4   Blood pressure at preop appt was in left loer arm 177/129, in right upper arm was 178/122 and in right lower arm was 194/123.  PT denies any chest pain shortness of breath , dizziness, headache or chest pain  PT states has not taken am meds for blood pressure this am.  Instructed pt at preop to take meds upon leaving preop appt and to keep check on blood pressure readings at home and to notify DR Gwenette Greet of blood pressure readings.  PT voiced understanding.  PT states at preop that he hwas in a heated discussion with 47year old regarding getting a dog prior to preop appt and that his sone had him upset. Leticia Clas made aware of above.  No new orders given.   Pt takes combo ( Azor ) for blood pressure and Bystolic.  PT takes one in am and one in pm.  PT differs at times when he takes which med.  Instructted pt to am of surgery to take Bystolic.  Noted on preop instrsuctions.  PT voiced understanding.     CBC and CMP done 04/29/23 at time of physical with PCP- in epic.

## 2023-04-29 NOTE — Progress Notes (Signed)
[name],acting as a Neurosurgeon for [name] Aliment, MD.,have documented all relevant documentation on the behalf of [name] Aliment, MD,as directed by  [name] Aliment, MD while in the presence of [name] Aliment, MD.   Subjective:     Patient ID: [name] Gordon , male    DOB: [birth year] , 47 y.o.   MRN: [id number]   Chief Complaint  Patient presents with  . Annual Exam  . Hypertension    HPI  The patient is here today for a physical examination. Patient reports compliance with his meds. He denies headache, chest pains and shortness of breath. Patient reports he went to see Dr.Gay for a biopsy about a month ago. Patient does not have any questions or concerns at this time.   Hypertension This is a chronic problem. The current episode started more than 1 year ago. The problem is unchanged. The problem is controlled. Pertinent negatives include no anxiety. There are no associated agents to hypertension. Risk factors for coronary artery disease include obesity, sedentary lifestyle and male gender. Past treatments include angiotensin blockers, calcium channel blockers and lifestyle changes. There are no compliance problems.      Past Medical History:  Diagnosis Date  . Fatty liver 2017   Mild  . History of gout   . Hypertension   . Morbid obesity (HCC)   . OA (osteoarthritis) of knee   . Spinal stenosis 2018     Family History  Problem Relation Age of Onset  . Hypertension Mother   . Diabetes Mother   . Cancer Father      Current Outpatient Medications:  .  amlodipine-olmesartan (AZOR) 10-20 MG tablet, Take 1 tablet by mouth daily., Disp: 90 tablet, Rfl: 1 .  colchicine 0.6 MG tablet, Take 2 tablets by mouth at the onset of gout pain and then 1 tablet in 1 hour. Do not take more than 3 tablets per gout flare up., Disp: 30 tablet, Rfl: 0 .  montelukast (SINGULAIR) 10 MG tablet, TAKE 1 TABLET BY MOUTH EVERY DAY, Disp: 90 tablet, Rfl: 1 .  nebivolol (BYSTOLIC) 10 MG  tablet, Take 1 tablet (10 mg total) by mouth daily., Disp: 90 tablet, Rfl: 1 .  oxyCODONE-acetaminophen (PERCOCET) 5-325 MG tablet, Take 1 tablet by mouth every 8 (eight) hours as needed for severe pain., Disp: 30 tablet, Rfl: 0 .  cholecalciferol (VITAMIN D3) 25 MCG (1000 UT) tablet, Take 1,000 Units by mouth daily. (Patient not taking: Reported on 04/29/2023), Disp: , Rfl:    No Known Allergies   Men's preventive visit. Patient Health Questionnaire (PHQ-2) is  Flowsheet Row Office Visit from 04/29/2023 in [facility]  PHQ-2 Total Score 4     . Patient is on a *** diet. Marital status: Married. Relevant history for alcohol use is:  Social History   Substance and Sexual Activity  Alcohol Use No  . Relevant history for tobacco use is:  Social History   Tobacco Use  Smoking Status Never  Smokeless Tobacco Never  .   Review of Systems  Constitutional: Negative.   HENT: Negative.    Eyes: Negative.   Respiratory: Negative.    Cardiovascular: Negative.   Gastrointestinal: Negative.   Endocrine: Negative.   Genitourinary: Negative.   Musculoskeletal: Negative.   Skin: Negative.   Neurological: Negative.   Hematological: Negative.   Psychiatric/Behavioral: Negative.       Today's Vitals   04/29/23 1111  BP: 126/70  Pulse: 81  Temp: 98.3 F (36.8 C)  Weight: (!) 395 lb 6.4 oz (179.4 kg)  Height: 5' 7.8" (1.722 m)  PainSc: 0-No pain   Body mass index is 60.48 kg/m.  Wt Readings from Last 3 Encounters:  04/29/23 (!) 395 lb 6.4 oz (179.4 kg)  02/24/23 (!) 385 lb 6.4 oz (174.8 kg)  12/16/22 (!) 384 lb 9.6 oz (174.5 kg)     Objective:  Physical Exam      Assessment And Plan:    1. Encounter for general adult medical examination w/o abnormal findings Comments: A full exam was performed. Importance of monthly self breast exams was discussed with the patient.  2. Essential hypertension, benign - POCT Urinalysis Dipstick (81002) -  Microalbumin / creatinine urine ratio - EKG 12-Lead  3. Other abnormal glucose  4. Class 3 severe obesity due to excess calories with serious comorbidity and body mass index (BMI) of 60.0 to 69.9 in adult (HCC)  5. Primary osteoarthritis of both knees  6. Adjustment disorder with depressed mood    Return for 1 year physical, 6 month bp. Patient was given opportunity to ask questions. Patient verbalized understanding of the plan and was able to repeat key elements of the plan. All questions were answered to their satisfaction.   I, [name] Aliment, MD, have reviewed all documentation for this visit. The documentation on 04/29/23 for the exam, diagnosis, procedures, and orders are all accurate and complete.   THE PATIENT IS ENCOURAGED TO PRACTICE SOCIAL DISTANCING DUE TO THE COVID-19 PANDEMIC.

## 2023-04-30 LAB — MICROALBUMIN / CREATININE URINE RATIO
Creatinine, Urine: 94.1 mg/dL
Microalb/Creat Ratio: 5 mg/g creat (ref 0–29)
Microalbumin, Urine: 4.6 ug/mL

## 2023-04-30 NOTE — Patient Instructions (Addendum)
SURGICAL WAITING ROOM VISITATION  Patients having surgery or a procedure may have no more than 2 support people in the waiting area - these visitors may rotate.    Children under the age of 42 must have an adult with them who is not the patient.  Due to an increase in RSV and influenza rates and associated hospitalizations, children ages 19 and under may not visit patients in [facility] hospitals.  If the patient needs to stay at the hospital during part of their recovery, the visitor guidelines for inpatient rooms apply. Pre-op nurse will coordinate an appropriate time for 1 support person to accompany patient in pre-op.  This support person may not rotate.    Please refer to the [facility] website for the visitor guidelines for Inpatients (after your surgery is over and you are in a regular room).       Your procedure is scheduled on:  05/19/2023    Report to [facility] Main Entrance    Report to admitting at  0515 AM   Call this number if you have problems the morning of surgery [phone]   Do not eat food :After Midnight.   After Midnight you may have the following liquids until __ 0430____ AM DAY OF SURGERY  Water Non-Citrus Juices (without pulp, NO RED-Apple, White grape, White cranberry) Black Coffee (NO MILK/CREAM OR CREAMERS, sugar ok)  Clear Tea (NO MILK/CREAM OR CREAMERS, sugar ok) regular and decaf                             Plain Jell-O (NO RED)                                           Fruit ices (not with fruit pulp, NO RED)                                     Popsicles (NO RED)                                                               Sports drinks like Gatorade (NO RED)                      The day of surgery:  Drink ONE (1) Pre-Surgery Clear Ensure or G2 at  0430 AM  ( have completed by ) the morning of surgery. Drink in one sitting. Do not sip.  This drink was given to you during your hospital  pre-op appointment visit. Nothing else to  drink after completing the  Pre-Surgery Clear Ensure or G2.          If you have questions, please contact your surgeon's office.       Oral Hygiene is also important to reduce your risk of infection.                                    Remember - BRUSH YOUR TEETH THE MORNING OF  SURGERY WITH YOUR REGULAR TOOTHPASTE  DENTURES WILL BE REMOVED PRIOR TO SURGERY PLEASE DO NOT APPLY "Poly grip" OR ADHESIVES!!!   Do NOT smoke after Midnight   Take these medicines the morning of surgery with A SIP OF WATER: Bystolic , eye drops   DO NOT TAKE ANY ORAL DIABETIC MEDICATIONS DAY OF YOUR SURGERY  Bring CPAP mask and tubing day of surgery.                              You may not have any metal on your body including hair pins, jewelry, and body piercing             Do not wear make-up, lotions, powders, perfumes/cologne, or deodorant  Do not wear nail polish including gel and S&S, artificial/acrylic nails, or any other type of covering on natural nails including finger and toenails. If you have artificial nails, gel coating, etc. that needs to be removed by a nail salon please have this removed prior to surgery or surgery may need to be canceled/ delayed if the surgeon/ anesthesia feels like they are unable to be safely monitored.   Do not shave  48 hours prior to surgery.               Men may shave face and neck.   Do not bring valuables to the hospital. [facility] IS NOT             RESPONSIBLE   FOR VALUABLES.   Contacts, glasses, dentures or bridgework may not be worn into surgery.   Bring small overnight bag day of surgery.   DO NOT BRING YOUR HOME MEDICATIONS TO THE HOSPITAL. PHARMACY WILL DISPENSE MEDICATIONS LISTED ON YOUR MEDICATION LIST TO YOU DURING YOUR ADMISSION IN THE HOSPITAL!    Patients discharged on the day of surgery will not be allowed to drive home.  Someone NEEDS to stay with you for the first 24 hours after anesthesia.   Special Instructions: Bring a copy of your  healthcare power of attorney and living will documents the day of surgery if you haven't scanned them before.              Please read over the following fact sheets you were given: IF YOU HAVE QUESTIONS ABOUT YOUR PRE-OP INSTRUCTIONS PLEASE CALL [phone]   If you received a COVID test during your pre-op visit  it is requested that you wear a mask when out in public, stay away from anyone that may not be feeling well and notify your surgeon if you develop symptoms. If you test positive for Covid or have been in contact with anyone that has tested positive in the last 10 days please notify you surgeon.    [place] - Preparing for Surgery Before surgery, you can play an important role.  Because skin is not sterile, your skin needs to be as free of germs as possible.  You can reduce the number of germs on your skin by washing with CHG (chlorahexidine gluconate) soap before surgery.  CHG is an antiseptic cleaner which kills germs and bonds with the skin to continue killing germs even after washing. Please DO NOT use if you have an allergy to CHG or antibacterial soaps.  If your skin becomes reddened/irritated stop using the CHG and inform your nurse when you arrive at Short Stay. Do not shave (including legs and underarms) for at least 48 hours prior to  the first CHG shower.  You may shave your face/neck. Please follow these instructions carefully:  1.  Shower with CHG Soap the night before surgery and the  morning of Surgery.  2.  If you choose to wash your hair, wash your hair first as usual with your  normal  shampoo.  3.  After you shampoo, rinse your hair and body thoroughly to remove the  shampoo.                           4.  Use CHG as you would any other liquid soap.  You can apply chg directly  to the skin and wash                       Gently with a scrungie or clean washcloth.  5.  Apply the CHG Soap to your body ONLY FROM THE NECK DOWN.   Do not use on face/ open                            Wound or open sores. Avoid contact with eyes, ears mouth and genitals (private parts).                       Wash face,  Genitals (private parts) with your normal soap.             6.  Wash thoroughly, paying special attention to the area where your surgery  will be performed.  7.  Thoroughly rinse your body with warm water from the neck down.  8.  DO NOT shower/wash with your normal soap after using and rinsing off  the CHG Soap.                9.  Pat yourself dry with a clean towel.            10.  Wear clean pajamas.            11.  Place clean sheets on your bed the night of your first shower and do not  sleep with pets. Day of Surgery : Do not apply any lotions/deodorants the morning of surgery.  Please wear clean clothes to the hospital/surgery center.  FAILURE TO FOLLOW THESE INSTRUCTIONS MAY RESULT IN THE CANCELLATION OF YOUR SURGERY PATIENT SIGNATURE_________________________________  NURSE SIGNATURE__________________________________  ________________________________________________________________________

## 2023-05-01 ENCOUNTER — Other Ambulatory Visit: Payer: Self-pay | Admitting: Internal Medicine

## 2023-05-01 ENCOUNTER — Other Ambulatory Visit: Payer: Self-pay

## 2023-05-01 MED ORDER — OLOPATADINE HCL 0.2 % OP SOLN: OPHTHALMIC | 0 refills | Status: AC

## 2023-05-01 MED ORDER — AMLODIPINE-OLMESARTAN 10-20 MG PO TABS: 1.0000 | ORAL_TABLET | Freq: Every day | ORAL | 1 refills | Status: DC

## 2023-05-01 MED ORDER — NEBIVOLOL HCL 10 MG PO TABS: 10.0000 mg | ORAL_TABLET | Freq: Every day | ORAL | 1 refills | Status: DC

## 2023-05-02 LAB — CBC WITH DIFFERENTIAL/PLATELET
Basophils Absolute: 0.1 10*3/uL (ref 0.0–0.2)
Basos: 1 %
EOS (ABSOLUTE): 0.1 10*3/uL (ref 0.0–0.4)
Eos: 1 %
Hematocrit: 42.7 % (ref 37.5–51.0)
Hemoglobin: 13.7 g/dL (ref 13.0–17.7)
Immature Grans (Abs): 0 10*3/uL (ref 0.0–0.1)
Immature Granulocytes: 1 %
Lymphocytes Absolute: 2.1 10*3/uL (ref 0.7–3.1)
Lymphs: 25 %
MCH: 26.6 pg (ref 26.6–33.0)
MCHC: 32.1 g/dL (ref 31.5–35.7)
MCV: 83 fL (ref 79–97)
Monocytes Absolute: 0.7 10*3/uL (ref 0.1–0.9)
Monocytes: 9 %
Neutrophils Absolute: 5.4 10*3/uL (ref 1.4–7.0)
Neutrophils: 63 %
Platelets: 289 10*3/uL (ref 150–450)
RBC: 5.16 x10E6/uL (ref 4.14–5.80)
RDW: 13.8 % (ref 11.6–15.4)
WBC: 8.4 10*3/uL (ref 3.4–10.8)

## 2023-05-02 LAB — CMP14+EGFR
ALT: 32 IU/L (ref 0–44)
AST: 23 IU/L (ref 0–40)
Albumin/Globulin Ratio: 1.8 (ref 1.2–2.2)
Albumin: 4.4 g/dL (ref 4.1–5.1)
Alkaline Phosphatase: 92 IU/L (ref 44–121)
BUN/Creatinine Ratio: 14 (ref 9–20)
BUN: 13 mg/dL (ref 6–24)
Bilirubin Total: 0.3 mg/dL (ref 0.0–1.2)
CO2: 24 mmol/L (ref 20–29)
Calcium: 9.5 mg/dL (ref 8.7–10.2)
Chloride: 103 mmol/L (ref 96–106)
Creatinine, Ser: 0.96 mg/dL (ref 0.76–1.27)
Globulin, Total: 2.4 g/dL (ref 1.5–4.5)
Glucose: 93 mg/dL (ref 70–99)
Potassium: 4.3 mmol/L (ref 3.5–5.2)
Sodium: 143 mmol/L (ref 134–144)
Total Protein: 6.8 g/dL (ref 6.0–8.5)
eGFR: 98 mL/min/{1.73_m2} (ref >59)

## 2023-05-02 LAB — LIPID PANEL
Chol/HDL Ratio: 3.4 ratio (ref 0.0–5.0)
Cholesterol, Total: 192 mg/dL (ref 100–199)
HDL: 56 mg/dL (ref >39)
LDL Chol Calc (NIH): 111 mg/dL — ABNORMAL HIGH (ref 0–99)
Triglycerides: 141 mg/dL (ref 0–149)
VLDL Cholesterol Cal: 25 mg/dL (ref 5–40)

## 2023-05-02 LAB — QUANTIFERON-TB GOLD PLUS
QuantiFERON Mitogen Value: >10 IU/mL
QuantiFERON Nil Value: 0.05 IU/mL
QuantiFERON TB1 Ag Value: 0.03 IU/mL
QuantiFERON TB2 Ag Value: 0.04 IU/mL
QuantiFERON-TB Gold Plus: NEGATIVE

## 2023-05-02 LAB — ANGIOTENSIN CONVERTING ENZYME: Angio Convert Enzyme: 55 U/L (ref 14–82)

## 2023-05-06 ENCOUNTER — Encounter (HOSPITAL_COMMUNITY): Payer: Self-pay

## 2023-05-06 ENCOUNTER — Encounter (HOSPITAL_COMMUNITY)
Admission: RE | Admit: 2023-05-06 | Discharge: 2023-05-06 | Disposition: A | Discharge status: Home / Self Care | Payer: BC Managed Care – PPO | Source: Ambulatory Visit | Attending: Orthopedic Surgery | Admitting: Orthopedic Surgery

## 2023-05-06 ENCOUNTER — Other Ambulatory Visit: Payer: Self-pay

## 2023-05-06 VITALS — BP 178/122 | HR 76 | Temp 98.6°F | Resp 16 | Ht 71.0 in | Wt 392.0 lb

## 2023-05-06 DIAGNOSIS — Z01818 Encounter for other preprocedural examination: Secondary | ICD-10-CM | POA: Diagnosis present

## 2023-05-06 HISTORY — DX: Prediabetes: R73.03

## 2023-05-06 HISTORY — DX: Personal history of urinary calculi: Z87.442

## 2023-05-06 LAB — GLUCOSE, CAPILLARY: Glucose-Capillary: 118 mg/dL — ABNORMAL HIGH (ref 70–99)

## 2023-05-07 LAB — HEMOGLOBIN A1C
Hgb A1c MFr Bld: 6.4 % — ABNORMAL HIGH (ref 4.8–5.6)
Mean Plasma Glucose: 137 mg/dL

## 2023-05-12 ENCOUNTER — Encounter (HOSPITAL_COMMUNITY): Payer: Self-pay

## 2023-05-19 ENCOUNTER — Other Ambulatory Visit: Payer: Self-pay

## 2023-05-19 ENCOUNTER — Ambulatory Visit (HOSPITAL_COMMUNITY): Payer: Worker's Compensation | Admitting: Physician Assistant

## 2023-05-19 ENCOUNTER — Encounter (HOSPITAL_COMMUNITY): Payer: Self-pay | Admitting: Orthopedic Surgery

## 2023-05-19 ENCOUNTER — Encounter (HOSPITAL_COMMUNITY)
Admission: RE | Disposition: A | Discharge status: Home / Self Care | Payer: Self-pay | Source: Ambulatory Visit | Attending: Orthopedic Surgery

## 2023-05-19 ENCOUNTER — Ambulatory Visit (HOSPITAL_COMMUNITY)
Admission: RE | Admit: 2023-05-19 | Discharge: 2023-05-19 | Disposition: A | Discharge status: Home / Self Care | Payer: Worker's Compensation | Source: Ambulatory Visit | Attending: Orthopedic Surgery | Admitting: Orthopedic Surgery

## 2023-05-19 ENCOUNTER — Ambulatory Visit (HOSPITAL_BASED_OUTPATIENT_CLINIC_OR_DEPARTMENT_OTHER): Payer: Worker's Compensation | Admitting: Anesthesiology

## 2023-05-19 DIAGNOSIS — S43432A Superior glenoid labrum lesion of left shoulder, initial encounter: Secondary | ICD-10-CM | POA: Insufficient documentation

## 2023-05-19 DIAGNOSIS — M75102 Unspecified rotator cuff tear or rupture of left shoulder, not specified as traumatic: Secondary | ICD-10-CM | POA: Diagnosis present

## 2023-05-19 DIAGNOSIS — Z01818 Encounter for other preprocedural examination: Secondary | ICD-10-CM

## 2023-05-19 DIAGNOSIS — X58XXXA Exposure to other specified factors, initial encounter: Secondary | ICD-10-CM | POA: Diagnosis not present

## 2023-05-19 DIAGNOSIS — I10 Essential (primary) hypertension: Secondary | ICD-10-CM

## 2023-05-19 DIAGNOSIS — M19012 Primary osteoarthritis, left shoulder: Secondary | ICD-10-CM | POA: Diagnosis present

## 2023-05-19 DIAGNOSIS — Z8249 Family history of ischemic heart disease and other diseases of the circulatory system: Secondary | ICD-10-CM | POA: Insufficient documentation

## 2023-05-19 DIAGNOSIS — S46212A Strain of muscle, fascia and tendon of other parts of biceps, left arm, initial encounter: Secondary | ICD-10-CM | POA: Diagnosis present

## 2023-05-19 DIAGNOSIS — Z79899 Other long term (current) drug therapy: Secondary | ICD-10-CM | POA: Insufficient documentation

## 2023-05-19 DIAGNOSIS — Z6841 Body Mass Index (BMI) 40.0 and over, adult: Secondary | ICD-10-CM

## 2023-05-19 DIAGNOSIS — M25812 Other specified joint disorders, left shoulder: Secondary | ICD-10-CM | POA: Diagnosis not present

## 2023-05-19 HISTORY — PX: SHOULDER ARTHROSCOPY WITH ROTATOR CUFF REPAIR: SHX5685

## 2023-05-19 LAB — GLUCOSE, CAPILLARY: Glucose-Capillary: 107 mg/dL — ABNORMAL HIGH (ref 70–99)

## 2023-05-19 SURGERY — ARTHROSCOPY, SHOULDER, WITH ROTATOR CUFF REPAIR
Anesthesia: Regional | Laterality: Left

## 2023-05-19 MED ORDER — ROCURONIUM BROMIDE 100 MG/10ML IV SOLN
INTRAVENOUS | Status: DC | PRN
  Administered 2023-05-19: 100 mg via INTRAVENOUS
  Administered 2023-05-19: 5 mg via INTRAVENOUS
  Administered 2023-05-19: 10 mg via INTRAVENOUS

## 2023-05-19 MED ORDER — SODIUM CHLORIDE 0.9 % IR SOLN
Status: DC | PRN
  Administered 2023-05-19: 3000 mL

## 2023-05-19 MED ORDER — BUPIVACAINE-EPINEPHRINE 0.5% -1:200000 IJ SOLN
INTRAMUSCULAR | Status: DC | PRN
  Administered 2023-05-19: 20 mL

## 2023-05-19 MED ORDER — FENTANYL CITRATE PF 50 MCG/ML IJ SOSY
25.0000 ug | PREFILLED_SYRINGE | INTRAMUSCULAR | Status: DC | PRN
  Administered 2023-05-19 (×2): 50 ug via INTRAVENOUS

## 2023-05-19 MED ORDER — TIZANIDINE HCL 2 MG PO TABS: 2.0000 mg | ORAL_TABLET | Freq: Three times a day (TID) | ORAL | 0 refills | Status: DC | PRN

## 2023-05-19 MED ORDER — FENTANYL CITRATE (PF) 250 MCG/5ML IJ SOLN
INTRAMUSCULAR | Status: AC
  Filled 2023-05-19: qty 5

## 2023-05-19 MED ORDER — PROPOFOL 10 MG/ML IV BOLUS
INTRAVENOUS | Status: DC | PRN
  Administered 2023-05-19: 200 mg via INTRAVENOUS

## 2023-05-19 MED ORDER — OXYCODONE-ACETAMINOPHEN 5-325 MG PO TABS: 1.0000 | ORAL_TABLET | Freq: Four times a day (QID) | ORAL | 0 refills | Status: DC | PRN

## 2023-05-19 MED ORDER — FENTANYL CITRATE PF 50 MCG/ML IJ SOSY
PREFILLED_SYRINGE | INTRAMUSCULAR | Status: AC
  Filled 2023-05-19: qty 2

## 2023-05-19 MED ORDER — AMISULPRIDE (ANTIEMETIC) 5 MG/2ML IV SOLN
10.0000 mg | Freq: Once | INTRAVENOUS | Status: AC
  Administered 2023-05-19: 10 mg via INTRAVENOUS

## 2023-05-19 MED ORDER — ORAL CARE MOUTH RINSE: 15.0000 mL | Freq: Once | OROMUCOSAL | Status: AC

## 2023-05-19 MED ORDER — PROPOFOL 10 MG/ML IV BOLUS
INTRAVENOUS | Status: AC
  Filled 2023-05-19: qty 20

## 2023-05-19 MED ORDER — DEXAMETHASONE SODIUM PHOSPHATE 10 MG/ML IJ SOLN
INTRAMUSCULAR | Status: DC | PRN
  Administered 2023-05-19: 8 mg via INTRAVENOUS

## 2023-05-19 MED ORDER — LABETALOL HCL 5 MG/ML IV SOLN
INTRAVENOUS | Status: DC | PRN
  Administered 2023-05-19 (×2): 2.5 mg via INTRAVENOUS

## 2023-05-19 MED ORDER — AMISULPRIDE (ANTIEMETIC) 5 MG/2ML IV SOLN
INTRAVENOUS | Status: AC
  Filled 2023-05-19: qty 4

## 2023-05-19 MED ORDER — CEFAZOLIN IN SODIUM CHLORIDE 3-0.9 GM/100ML-% IV SOLN
3.0000 g | INTRAVENOUS | Status: AC
  Administered 2023-05-19: 3 g via INTRAVENOUS
  Filled 2023-05-19: qty 100

## 2023-05-19 MED ORDER — MIDAZOLAM HCL 5 MG/5ML IJ SOLN
INTRAMUSCULAR | Status: DC | PRN
  Administered 2023-05-19: 1 mg via INTRAVENOUS

## 2023-05-19 MED ORDER — MIDAZOLAM HCL 2 MG/2ML IJ SOLN
INTRAMUSCULAR | Status: AC
  Filled 2023-05-19: qty 2

## 2023-05-19 MED ORDER — BUPIVACAINE LIPOSOME 1.3 % IJ SUSP
INTRAMUSCULAR | Status: DC | PRN
  Administered 2023-05-19: 10 mL via PERINEURAL

## 2023-05-19 MED ORDER — MIDAZOLAM HCL 2 MG/2ML IJ SOLN
1.0000 mg | INTRAMUSCULAR | Status: DC
  Administered 2023-05-19: 2 mg via INTRAVENOUS
  Filled 2023-05-19: qty 2

## 2023-05-19 MED ORDER — ONDANSETRON HCL 4 MG/2ML IJ SOLN
INTRAMUSCULAR | Status: DC | PRN
  Administered 2023-05-19: 4 mg via INTRAVENOUS

## 2023-05-19 MED ORDER — ROCURONIUM BROMIDE 10 MG/ML (PF) SYRINGE
PREFILLED_SYRINGE | INTRAVENOUS | Status: AC
  Filled 2023-05-19: qty 10

## 2023-05-19 MED ORDER — BUPIVACAINE HCL (PF) 0.5 % IJ SOLN
INTRAMUSCULAR | Status: AC
  Filled 2023-05-19: qty 30

## 2023-05-19 MED ORDER — FENTANYL CITRATE PF 50 MCG/ML IJ SOSY
50.0000 ug | PREFILLED_SYRINGE | INTRAMUSCULAR | Status: DC
  Administered 2023-05-19: 100 ug via INTRAVENOUS
  Filled 2023-05-19: qty 2

## 2023-05-19 MED ORDER — DEXAMETHASONE SODIUM PHOSPHATE 10 MG/ML IJ SOLN
INTRAMUSCULAR | Status: AC
  Filled 2023-05-19: qty 1

## 2023-05-19 MED ORDER — SUGAMMADEX SODIUM 200 MG/2ML IV SOLN
INTRAVENOUS | Status: DC | PRN
  Administered 2023-05-19: 250 mg via INTRAVENOUS

## 2023-05-19 MED ORDER — ACETAMINOPHEN 500 MG PO TABS
1000.0000 mg | ORAL_TABLET | Freq: Once | ORAL | Status: AC
  Administered 2023-05-19: 1000 mg via ORAL
  Filled 2023-05-19: qty 2

## 2023-05-19 MED ORDER — LIDOCAINE HCL (PF) 2 % IJ SOLN
INTRAMUSCULAR | Status: AC
  Filled 2023-05-19: qty 5

## 2023-05-19 MED ORDER — CHLORHEXIDINE GLUCONATE 0.12 % MT SOLN
15.0000 mL | Freq: Once | OROMUCOSAL | Status: AC
  Administered 2023-05-19: 15 mL via OROMUCOSAL

## 2023-05-19 MED ORDER — LABETALOL HCL 5 MG/ML IV SOLN
INTRAVENOUS | Status: AC
  Filled 2023-05-19: qty 4

## 2023-05-19 MED ORDER — EPINEPHRINE PF 1 MG/ML IJ SOLN
INTRAMUSCULAR | Status: AC
  Filled 2023-05-19: qty 2

## 2023-05-19 MED ORDER — LACTATED RINGERS IV SOLN: INTRAVENOUS | Status: DC

## 2023-05-19 MED ORDER — FENTANYL CITRATE (PF) 100 MCG/2ML IJ SOLN
INTRAMUSCULAR | Status: DC | PRN
  Administered 2023-05-19: 50 ug via INTRAVENOUS
  Administered 2023-05-19 (×2): 25 ug via INTRAVENOUS

## 2023-05-19 MED ORDER — BUPIVACAINE HCL (PF) 0.5 % IJ SOLN
INTRAMUSCULAR | Status: DC | PRN
  Administered 2023-05-19: 15 mL via PERINEURAL

## 2023-05-19 MED ORDER — DEXMEDETOMIDINE HCL IN NACL 80 MCG/20ML IV SOLN
INTRAVENOUS | Status: DC | PRN
  Administered 2023-05-19: 8 ug via INTRAVENOUS

## 2023-05-19 MED ORDER — ONDANSETRON HCL 4 MG/2ML IJ SOLN
INTRAMUSCULAR | Status: AC
  Filled 2023-05-19: qty 2

## 2023-05-19 SURGICAL SUPPLY — 69 items
AID PSTN UNV HD RSTRNT DISP (MISCELLANEOUS) ×1
ANCH SUT 2 SWLK 19.1 KNTLS LP (Anchor) ×2 IMPLANT
ANCH SUT FBRTAPE 1.3X2.6X1.7 (Anchor) ×1 IMPLANT
ANCH SUT FBRTK 1.3 2 TPE (Anchor) ×1 IMPLANT
ANCH SUT TGRTAPE 1.3X2.6X1.7 (Anchor) ×1 IMPLANT
ANCHOR FBRTK 2.6 SUTURETAP 1.3 (Anchor) IMPLANT
ANCHOR SUT FBRTK 2.6 SOFT 1.7 (Anchor) IMPLANT
ANCHOR SUT FBRTK 2.6 SP1.7 TAP (Anchor) IMPLANT
ANCHOR SUT SWIVELOCK 4.75X19.1 (Anchor) IMPLANT
APL PRP STRL LF DISP 70% ISPRP (MISCELLANEOUS) ×1
BAG COUNTER SPONGE SURGICOUNT (BAG) IMPLANT
BAG SPNG CNTER NS LX DISP (BAG)
BLADE EXCALIBUR 4.0X13 (MISCELLANEOUS) ×1 IMPLANT
BLADE SURG SZ11 CARB STEEL (BLADE) ×1 IMPLANT
BOOTIES KNEE HIGH SLOAN (MISCELLANEOUS) ×2 IMPLANT
BUR OVAL CARBIDE 4.0 (BURR) ×1 IMPLANT
BURR OVAL 8 FLU 4.0X13 (MISCELLANEOUS) ×1 IMPLANT
CANNULA ACUFLEX KIT 5X76 (CANNULA) IMPLANT
CANNULA DRILOCK 5.0X75 (CANNULA) IMPLANT
CANNULA TWIST IN 8.25X7CM (CANNULA) IMPLANT
CHLORAPREP W/TINT 26 (MISCELLANEOUS) ×1 IMPLANT
CONNECTOR 5 IN 1 STRAIGHT STRL (MISCELLANEOUS) IMPLANT
COVER SURGICAL LIGHT HANDLE (MISCELLANEOUS) ×1 IMPLANT
DISSECTOR 3.8MM X 13CM (MISCELLANEOUS) IMPLANT
DRAPE INCISE 23X17 STRL (DRAPES) ×1 IMPLANT
DRAPE INCISE IOBAN 23X17 STRL (DRAPES) ×1 IMPLANT
DRAPE INCISE IOBAN 66X45 STRL (DRAPES) ×1 IMPLANT
DRAPE ORTHO SPLIT 77X108 STRL (DRAPES) ×2
DRAPE STERI 35X30 U-POUCH (DRAPES) ×1 IMPLANT
DRAPE SURG 17X11 SM STRL (DRAPES) IMPLANT
DRAPE SURG ORHT 6 SPLT 77X108 (DRAPES) ×2 IMPLANT
DRAPE U-SHAPE 47X51 STRL (DRAPES) ×1 IMPLANT
DURAPREP 26ML APPLICATOR (WOUND CARE) ×1 IMPLANT
GAUZE PAD ABD 8X10 STRL (GAUZE/BANDAGES/DRESSINGS) ×2 IMPLANT
GAUZE SPONGE 4X4 12PLY STRL (GAUZE/BANDAGES/DRESSINGS) ×1 IMPLANT
GAUZE XEROFORM 5X9 LF (GAUZE/BANDAGES/DRESSINGS) IMPLANT
GLOVE BIO SURGEON STRL SZ8 (GLOVE) ×1 IMPLANT
GLOVE ECLIPSE 7.5 STRL STRAW (GLOVE) ×1 IMPLANT
GOWN STRL REUS W/ TWL XL LVL3 (GOWN DISPOSABLE) ×2 IMPLANT
GOWN STRL REUS W/TWL XL LVL3 (GOWN DISPOSABLE) ×2
KIT BASIN OR (CUSTOM PROCEDURE TRAY) ×1 IMPLANT
KIT TURNOVER KIT A (KITS) IMPLANT
MANIFOLD NEPTUNE II (INSTRUMENTS) ×2 IMPLANT
NDL SCORPION MULTI FIRE (NEEDLE) IMPLANT
NDL SPNL 18GX3.5 QUINCKE PK (NEEDLE) ×1 IMPLANT
NEEDLE SCORPION MULTI FIRE (NEEDLE) ×1 IMPLANT
NEEDLE SPNL 18GX3.5 QUINCKE PK (NEEDLE) ×1 IMPLANT
PACK SHOULDER (CUSTOM PROCEDURE TRAY) ×1 IMPLANT
PORT APPOLLO RF 90DEGREE MULTI (SURGICAL WAND) IMPLANT
PROBE APOLLO 90XL (SURGICAL WAND) ×1 IMPLANT
PROTECTOR NERVE ULNAR (MISCELLANEOUS) ×1 IMPLANT
RESTRAINT HEAD UNIVERSAL NS (MISCELLANEOUS) ×1 IMPLANT
SLING ARM IMMOBILIZER LRG (SOFTGOODS) ×1 IMPLANT
SLING ARM IMMOBILIZER MED (SOFTGOODS) ×1 IMPLANT
SLING ARM IMMOBILIZER XL (CAST SUPPLIES) IMPLANT
STRIP CLOSURE SKIN 1/2X4 (GAUZE/BANDAGES/DRESSINGS) ×1 IMPLANT
SUT ETHILON 3 0 PS 1 (SUTURE) IMPLANT
SUT ETHILON 4 0 PS 2 18 (SUTURE) ×1 IMPLANT
SUT MNCRL AB 3-0 PS2 18 (SUTURE) ×1 IMPLANT
SUT PDS AB 0 CT 36 (SUTURE) IMPLANT
SUT TIGER TAPE 7 IN WHITE (SUTURE) IMPLANT
SUT VIC AB 0 CT1 36 (SUTURE) IMPLANT
SUT VIC AB 1 CT1 27 (SUTURE) ×1
SUT VIC AB 1 CT1 27XBRD ANBCTR (SUTURE) IMPLANT
SUT VIC AB 2-0 CT1 27 (SUTURE) ×1
SUT VIC AB 2-0 CT1 TAPERPNT 27 (SUTURE) IMPLANT
SYR 20ML LL LF (SYRINGE) ×1 IMPLANT
TUBING ARTHROSCOPY IRRIG 16FT (MISCELLANEOUS) ×1 IMPLANT
TUBING CONNECTING 10 (TUBING) ×1 IMPLANT

## 2023-05-19 NOTE — Anesthesia Procedure Notes (Signed)
Procedure Name: Intubation Date/Time: 05/19/2023 1:22 PM  Performed by: Garth Bigness, CRNAPre-anesthesia Checklist: Patient identified, Emergency Drugs available, Suction available and Patient being monitored Patient Re-evaluated:Patient Re-evaluated prior to induction Oxygen Delivery Method: Circle system utilized Preoxygenation: Pre-oxygenation with 100% oxygen Induction Type: IV induction Ventilation: Mask ventilation without difficulty Laryngoscope Size: Glidescope Grade View: Grade I Tube type: Oral Tube size: 7.5 mm Number of attempts: 1 Airway Equipment and Method: Stylet Placement Confirmation: ETT inserted through vocal cords under direct vision, positive ETCO2 and breath sounds checked- equal and bilateral Secured at: 25 cm Tube secured with: Tape Dental Injury: Teeth and Oropharynx as per pre-operative assessment  Difficulty Due To: Difficulty was anticipated

## 2023-05-19 NOTE — Brief Op Note (Signed)
05/19/2023  3:37 PM  PATIENT:  [name] Gordon  47 y.o. male  PRE-OPERATIVE DIAGNOSIS:  LEFT SHOULDER ROTATOR CUFF TEAR  POST-OPERATIVE DIAGNOSIS:  LEFT SHOULDER ROTATOR CUFF TEAR  PROCEDURE:  Procedure(s): SHOULDER ARTHROSCOPY WITH MINI OPEN ROTATOR CUFF REPAIR, BICEPS TENODESIS, DEBRIDEMENT, DISTAL CLAVICLE RESECTION (Left)  SURGEON:  Surgeon(s) and Role:    Jodi Geralds, MD - Primary  PHYSICIAN ASSISTANT:   ASSISTANTS: jim bethune   ANESTHESIA:   general  EBL:  50 mL   BLOOD ADMINISTERED:none  DRAINS: none   LOCAL MEDICATIONS USED:  MARCAINE     SPECIMEN:  No Specimen  DISPOSITION OF SPECIMEN:  N/A  COUNTS:  YES  TOURNIQUET:  * No tourniquets in log *  DICTATION: .Other Dictation: Dictation Number [id number]  PLAN OF CARE: Discharge to home after PACU  PATIENT DISPOSITION:  PACU - hemodynamically stable.   Delay start of Pharmacological VTE agent (>24hrs) due to surgical blood loss or risk of bleeding: no

## 2023-05-19 NOTE — Op Note (Unsigned)
NAMEJARIAN, Gordon MEDICAL RECORD NO: [id number] ACCOUNT NO: [id number] DATE OF BIRTH: [birth year] FACILITY: [facility] LOCATION: [facility] PHYSICIAN: [name], MD  Operative Report   DATE OF PROCEDURE: 05/19/2023  PREOPERATIVE DIAGNOSES: 1.  Rotator cuff tear, left shoulder with fraying of the biceps tendon. 2.  Impingement. 3.  AC joint arthritis.  POSTOPERATIVE DIAGNOSES: 1.  Rotator cuff tear, left shoulder with fraying of the biceps tendon. 2.  Impingement. 3.  AC joint arthritis. 4.  Superior labral tear anterior to posterior and fray of the subscapularis tendon anteriorly.  PRINCIPAL PROCEDURE: 1.  Mini open rotator cuff repair with a 2 x 2 construct consisting of four limbs of the FiberWire and LabralTape as well as a rescue stitch on the most posterior portion. 2.  Open biceps tenodesis with an all suture anchor and 2 limbs of LabralTape. 3.  Arthroscopic subacromial decompression. 4.  Arthroscopic distal clavicle resection. 5.  Arthroscopic debridement, extensive of subscapularis tendon, biceps stump, posterior labrum, anterior labrum, extensive subacromial bursa. 6. Complexity around high BMI and extended peri-op and intraoperative time  SURGEON:  [name], MD  ASSISTANT:  Gus Puma, PA-C, who was present for entire case and assisted by retraction of the arm, manipulation of tissues and closing to minimize OR time.  BRIEF HISTORY:  The patient is a morbidly obese 47 year old male with long history of left shoulder pain.  He had been treated conservatively for a prolonged period of time after failure of all conservative care.  He was taken to the operating room for  rotator cuff repair after MRI showed rotator cuff tear and biceps issues.  The patient needed to be done at an inpatient facility because of his large size of BMI of 55.  He was brought to the operating room for these procedures.  DESCRIPTION OF PROCEDURE:  Patient was brought to the operating room.   After adequate anesthesia was obtained with a GlideScope and general anesthetic, the patient was placed supine on the bed.  He was then moved up into the head holder, which was very  complicated based on his size and we were really not able to take off the lateral shoulder areas, as that would have put him in danger of falling off the table.  This complicated the case by making it not easily to get the scope position where it needed  to be because the table was in the way.  At this point, the left arm was prepped and draped in the usual sterile fashion and arthroscopic examination was begun complicated again by the large size.  We were able ultimately to get the instruments in place,  but certainly this took much longer than typical to get in the appropriate position.  Once we were in, we were able to see the subscapularis tendon was partially torn on the anterior side.  This was debrided with a suction shaver extensively.  I went  around to look at the subscap insertion.  It did look to be inserted well.  Attention was turned to the biceps tendon, which had significant fraying and this was released within the glenohumeral joint.  Anterior labral tear was debrided, posterior labral  tear was debrided and the stump of the biceps tendon was extensively debrided. Turned to the rotator cuff undersurface and debrided that, you could tell that the rotator cuff was torn as we anticipated by his MRI, although not read that way by the  radiologist.  At this point, we went up into  the subacromial space.  Again, issues of size and location of the portals was complicated by the patient's large size, and we were ultimately able to get into the subacromial space and we were able to identify  the acromion and an anterolateral acromioplasty was performed over a 2 cm area.  Attention was turned to the distal clavicle where again finding the correct portal and location of portal was complicated, but ultimately we were able to  get into the area  of the distal clavicle and this was debrided with a bur over 20 mm back to a smooth rim and then an Arthrocare wand was used to burn the distal clavicle to prevent any potential for regrowth.  Following this, attention was turned towards the bursa, which  was extensively debrided in looking at the cuff edge and we could see the obvious tear of the rotator cuff as we anticipated by a reading of the MRI.  At this point, we again try to localize what will be the best positioning for this and failing that we  need to do a biceps tenodesis and felt that open incision was appropriate.  A lateral incision was made, needed to be extensively extended based on the patient's large size.  We were able to exploit the deltoid in the interval, but we did not have a retractor that would get deep enough to get under the deltoid, which complicated  the case. We had to do it mostly by hand with handheld retractors as we did not have any retractors deep enough to get through the deltoid.  Once we were able to identify the cuff tear, we basically debrided the lateral humeral head and then put 2 all  suture anchors at the border of the articular surface.  Once this was done, 8 limbs were passed, 2 FiberTapes and 2 LabralTapes giving 8 total limbs.  We then took the first, third, fifth and seventh and brought them anteriorly.  We took the second,  fourth, sixth and eighth, took them under and brought them posteriorly and then set these 2 with 4.75 mm lateral SwiveLocks, again complicated to find the lateral positioning based on the patient's size.  Very nice watertight rotator cuff repair was  achieved.  At that point, attention was then turned to the biceps tendon, which was again complicated by the patient's size to try to get to the appropriate area and we ultimately able to get to where the biceps tendon was in the groove.  We held his arm  straight in the externally rotated position, so we could get to it  and then put an all suture anchor and then passed 2 limbs with multiple throws through that biceps tendon and then attached it down in the groove with a freehand knot tying.  Excellent  biceps tenodesis was achieved and we resected the remaining portion of the biceps tendon above the repair.  Once this was done, attention was turned towards check and repair.  Excellent repair was achieved of the cuff and the biceps tendon.  We irrigated  the subacromial space.  We get a gloved finger under there as there was plenty of space for the head to move.  At this point, the deltoid was again closed with 1 Vicryl running, again complicated to identify the planes and the fascia overlying the cuff,  so that we could get a good closure without having to violate deltoid tissue.  We got a very nice closure here and the skin was closed  with 2-0 Vicryl and nylon stitches for the skin.  Sterile compressive dressing was applied.  The patient was taken to  recovery where he was noted to be in satisfactory condition.  Estimated blood loss for procedure was 50 mL. Gus Puma was present for the entire case and assisted by retraction of tissues, manipulation of the arm and closing to minimize OR time.  Surgical procedure was complicated by patients large size and difficulty with airway.  Surgical time was also quite extended by issues surrounding large size including not being able to use the surgical table as per usual and getting instruments that would work on his large size.   NIK D: 05/19/2023 3:46:27 pm T: 05/19/2023 9:21:00 pm  JOB: [id number]/ [id number]

## 2023-05-19 NOTE — H&P (Signed)
A pre op hand p   Chief Complaint: Left shoulder pain  HPI: [name] Gordon is a 47 y.o. male who presents for evaluation of left shoulder pain. It has been present for greater than 3 months and has been worsening. He has failed conservative measures. Pain is rated as moderate.  Past Medical History:  Diagnosis Date   Fatty liver 2017   Mild   History of gout    History of kidney stones    Hypertension    Morbid obesity (HCC)    OA (osteoarthritis) of knee    Pre-diabetes    Spinal stenosis 2018   Past Surgical History:  Procedure Laterality Date   ARTHROSCOPY KNEE W/ DRILLING     COLONOSCOPY WITH PROPOFOL N/A 06/01/2019   Procedure: COLONOSCOPY WITH PROPOFOL;  Surgeon: Jeani Hawking, MD;  Location: [facility];  Service: Endoscopy;  Laterality: N/A;   POLYPECTOMY  06/01/2019   Procedure: POLYPECTOMY;  Surgeon: Jeani Hawking, MD;  Location: [facility];  Service: Endoscopy;;   Social History   Socioeconomic History   Marital status: Married    Spouse name: Not on file   Number of children: Not on file   Years of education: Not on file   Highest education level: Not on file  Occupational History   Not on file  Tobacco Use   Smoking status: Never   Smokeless tobacco: Never  Vaping Use   Vaping Use: Never used  Substance and Sexual Activity   Alcohol use: Yes    Comment: 2 days per week   Drug use: No   Sexual activity: Yes    Birth control/protection: None  Other Topics Concern   Not on file  Social History Narrative   Not on file   Social Determinants of Health   Financial Resource Strain: Not on file  Food Insecurity: Not on file  Transportation Needs: Not on file  Physical Activity: Not on file  Stress: Not on file  Social Connections: Not on file   Family History  Problem Relation Age of Onset   Hypertension Mother    Diabetes Mother    Cancer Father    No Known Allergies Prior to Admission medications   Medication Sig Start Date End Date  Taking? Authorizing Provider  amlodipine-olmesartan (AZOR) 10-20 MG tablet Take 1 tablet by mouth daily. 05/01/23  Yes Dorothyann Peng, MD  colchicine 0.6 MG tablet Take 2 tablets by mouth at the onset of gout pain and then 1 tablet in 1 hour. Do not take more than 3 tablets per gout flare up. 10/30/21  Yes Ghumman, Ramandeep, NP  ibuprofen (ADVIL) 200 MG tablet Take 600 mg by mouth every 6 (six) hours as needed (Arthritis).   Yes [provider]  nebivolol (BYSTOLIC) 10 MG tablet Take 1 tablet (10 mg total) by mouth daily. 05/01/23  Yes Dorothyann Peng, MD  cholecalciferol (VITAMIN D3) 25 MCG (1000 UT) tablet Take 1,000 Units by mouth daily.    [provider]  montelukast (SINGULAIR) 10 MG tablet TAKE 1 TABLET BY MOUTH EVERY DAY Patient not taking: Reported on 05/02/2023 07/29/22   Dorothyann Peng, MD  Olopatadine HCl 0.2 % SOLN One gtt in both eyes daily 05/01/23   Dorothyann Peng, MD  oxyCODONE-acetaminophen (PERCOCET) 5-325 MG tablet Take 1 tablet by mouth every 8 (eight) hours as needed for severe pain. Patient not taking: Reported on 05/02/2023 04/29/23   Dorothyann Peng, MD     Positive ROS: None  All other systems have been  reviewed and were otherwise negative with the exception of those mentioned in the HPI and as above.  Physical Exam: There were no vitals filed for this visit.  General: Alert, no acute distress Cardiovascular: No pedal edema Respiratory: No cyanosis, no use of accessory musculature GI: No organomegaly, abdomen is soft and non-tender Skin: No lesions in the area of chief complaint Neurologic: Sensation intact distally Psychiatric: Patient is competent for consent with normal mood and affect Lymphatic: No axillary or cervical lymphadenopathy  MUSCULOSKELETAL: Left shoulder: Tender to palpation of the lateral aspect.  Weakness in external rotation.  Marked positive impingement findings.  Neurovascular intact distally.  Assessment/Plan: LEFT SHOULDER  ROTATOR CUFF TEAR Plan for Procedure(s): SHOULDER ARTHROSCOPY WITH MINI OPEN ROTATOR CUFF REPAIR  The risks benefits and alternatives were discussed with the patient including but not limited to the risks of nonoperative treatment, versus surgical intervention including infection, bleeding, nerve injury, malunion, nonunion, hardware prominence, hardware failure, need for hardware removal, blood clots, cardiopulmonary complications, morbidity, mortality, among others, and they were willing to proceed.  Predicted outcome is good, although there will be at least a six to nine month expected recovery.  [name], MD 05/19/2023 7:35 AM

## 2023-05-19 NOTE — Anesthesia Procedure Notes (Signed)
Anesthesia Regional Block: Interscalene brachial plexus block   Pre-Anesthetic Checklist: , timeout performed,  Correct Patient, Correct Site, Correct Laterality,  Correct Procedure, Correct Position, site marked,  Risks and benefits discussed,  Pre-op evaluation,  At surgeon's request and post-op pain management  Laterality: Left  Prep: Maximum Sterile Barrier Precautions used, chloraprep       Needles:  Injection technique: Single-shot  Needle Type: Echogenic Stimulator Needle     Needle Length: 9cm  Needle Gauge: 21     Additional Needles:   Procedures:,,,, ultrasound used (permanent image in chart),,    Narrative:  Start time: 05/19/2023 11:48 AM End time: 05/19/2023 11:52 AM Injection made incrementally with aspirations every 5 mL. Anesthesiologist: Elmer Picker, MD

## 2023-05-19 NOTE — Anesthesia Preprocedure Evaluation (Addendum)
Anesthesia Evaluation  Patient identified by MRN, date of birth, ID band Patient awake    Reviewed: Allergy & Precautions, NPO status , Patient's Chart, lab work & pertinent test results, reviewed documented beta blocker date and time   Airway Mallampati: II  TM Distance: >3 FB Neck ROM: Full  Mouth opening: Limited Mouth Opening  Dental no notable dental hx. (+) Teeth Intact, Dental Advisory Given   Pulmonary    Pulmonary exam normal breath sounds clear to auscultation       Cardiovascular hypertension, Pt. on medications and Pt. on home beta blockers Normal cardiovascular exam Rhythm:Regular Rate:Normal     Neuro/Psych negative neurological ROS  negative psych ROS   GI/Hepatic negative GI ROS, Neg liver ROS,,,  Endo/Other    Morbid obesity (BMI 56)  Renal/GU negative Renal ROS  negative genitourinary   Musculoskeletal  (+) Arthritis ,    Abdominal   Peds  Hematology negative hematology ROS (+)   Anesthesia Other Findings   Reproductive/Obstetrics                             Anesthesia Physical Anesthesia Plan  ASA: 3  Anesthesia Plan: General and Regional   Post-op Pain Management: Regional block* and Tylenol PO (pre-op)*   Induction: Intravenous  PONV Risk Score and Plan: 2 and Midazolam, Dexamethasone and Ondansetron  Airway Management Planned: Oral ETT and Video Laryngoscope Planned  Additional Equipment:   Intra-op Plan:   Post-operative Plan: Extubation in OR  Informed Consent: I have reviewed the patients History and Physical, chart, labs and discussed the procedure including the risks, benefits and alternatives for the proposed anesthesia with the patient or authorized representative who has indicated his/her understanding and acceptance.     Dental advisory given  Plan Discussed with: CRNA  Anesthesia Plan Comments:        Anesthesia Quick Evaluation

## 2023-05-19 NOTE — Transfer of Care (Signed)
Immediate Anesthesia Transfer of Care Note  Patient: [name] Gordon  Procedure(s) Performed: SHOULDER ARTHROSCOPY WITH MINI OPEN ROTATOR CUFF REPAIR, BICEPS TENODESIS, DEBRIDEMENT, DISTAL CLAVICLE RESECTION (Left)  Patient Location: PACU  Anesthesia Type:General  Level of Consciousness: awake, alert , oriented, and patient cooperative  Airway & Oxygen Therapy: Patient Spontanous Breathing and Patient connected to face mask oxygen  Post-op Assessment: Report given to RN and Post -op Vital signs reviewed and stable  Post vital signs: Reviewed and stable  Last Vitals:  Vitals Value Taken Time  BP 143/96 05/19/23 1551  Temp    Pulse 86 05/19/23 1555  Resp 19 05/19/23 1555  SpO2 97 % 05/19/23 1555  Vitals shown include unvalidated device data.  Last Pain:  Vitals:   05/19/23 1155  TempSrc:   PainSc: 0-No pain         Complications:  Encounter Notable Events  Notable Event Outcome Phase Comment  Difficult to intubate - expected  Intraprocedure Filed from anesthesia note documentation.

## 2023-05-19 NOTE — Interval H&P Note (Signed)
History and Physical Interval Note:  05/19/2023 1:21 PM  [name] Gordon  has presented today for surgery, with the diagnosis of LEFT SHOULDER ROTATOR CUFF TEAR.  The various methods of treatment have been discussed with the patient and family. After consideration of risks, benefits and other options for treatment, the patient has consented to  Procedure(s): SHOULDER ARTHROSCOPY WITH MINI OPEN ROTATOR CUFF REPAIR (Left) as a surgical intervention.  The patient's history has been reviewed, patient examined, no change in status, stable for surgery.  I have reviewed the patient's chart and labs.  Questions were answered to the patient's satisfaction.     [name]

## 2023-05-19 NOTE — Discharge Instructions (Signed)
Wear sling.  You may remove it for gentle elbow range of motion.  Use pain medication and muscle relaxers as prescribed.  Keep dressing in place until you follow-up Thursday a.m.

## 2023-05-20 ENCOUNTER — Encounter (HOSPITAL_COMMUNITY): Payer: Self-pay | Admitting: Orthopedic Surgery

## 2023-05-20 NOTE — Anesthesia Postprocedure Evaluation (Signed)
Anesthesia Post Note  Patient: [name] Gordon  Procedure(s) Performed: SHOULDER ARTHROSCOPY WITH MINI OPEN ROTATOR CUFF REPAIR, BICEPS TENODESIS, DEBRIDEMENT, DISTAL CLAVICLE RESECTION (Left)     Patient location during evaluation: PACU Anesthesia Type: Regional and General Level of consciousness: awake and alert Pain management: pain level controlled Vital Signs Assessment: post-procedure vital signs reviewed and stable Respiratory status: spontaneous breathing, nonlabored ventilation, respiratory function stable and patient connected to nasal cannula oxygen Cardiovascular status: blood pressure returned to baseline and stable Postop Assessment: no apparent nausea or vomiting Anesthetic complications: yes  Encounter Notable Events  Notable Event Outcome Phase Comment  Difficult to intubate - expected  Intraprocedure Filed from anesthesia note documentation.    Last Vitals:  Vitals:   05/19/23 1640 05/19/23 1645  BP:  (!) 142/95  Pulse: 86 84  Resp: 16 14  Temp:  36.5 C  SpO2: 95% 92%    Last Pain:  Vitals:   05/19/23 1645  TempSrc:   PainSc: 0-No pain                 Jesyca Weisenburger L Nastacia Raybuck

## 2023-07-31 ENCOUNTER — Other Ambulatory Visit: Payer: Self-pay | Admitting: Gastroenterology

## 2023-09-05 ENCOUNTER — Encounter (HOSPITAL_COMMUNITY): Payer: Self-pay | Admitting: Gastroenterology

## 2023-09-05 NOTE — Progress Notes (Signed)
Attempted to obtain medical history for pre op call via telephone, unable to reach at this time. HIPAA compliant voicemail message left requesting return call to pre surgical testing department.

## 2023-09-10 NOTE — Patient Instructions (Signed)
Hypertension, Adult Hypertension is another name for high blood pressure. High blood pressure forces your heart to work harder to pump blood. This can cause problems over time. There are two numbers in a blood pressure reading. There is a top number (systolic) over a bottom number (diastolic). It is best to have a blood pressure that is below 120/80. What are the causes? The cause of this condition is not known. Some other conditions can lead to high blood pressure. What increases the risk? Some lifestyle factors can make you more likely to develop high blood pressure: Smoking. Not getting enough exercise or physical activity. Being overweight. Having too much fat, sugar, calories, or salt (sodium) in your diet. Drinking too much alcohol. Other risk factors include: Having any of these conditions: Heart disease. Diabetes. High cholesterol. Kidney disease. Obstructive sleep apnea. Having a family history of high blood pressure and high cholesterol. Age. The risk increases with age. Stress. What are the signs or symptoms? High blood pressure may not cause symptoms. Very high blood pressure (hypertensive crisis) may cause: Headache. Fast or uneven heartbeats (palpitations). Shortness of breath. Nosebleed. Vomiting or feeling like you may vomit (nauseous). Changes in how you see. Very bad chest pain. Feeling dizzy. Seizures. How is this treated? This condition is treated by making healthy lifestyle changes, such as: Eating healthy foods. Exercising more. Drinking less alcohol. Your doctor may prescribe medicine if lifestyle changes do not help enough and if: Your top number is above 130. Your bottom number is above 80. Your personal target blood pressure may vary. Follow these instructions at home: Eating and drinking  If told, follow the DASH eating plan. To follow this plan: Fill one half of your plate at each meal with fruits and vegetables. Fill one fourth of your plate  at each meal with whole grains. Whole grains include whole-wheat pasta, brown rice, and whole-grain bread. Eat or drink low-fat dairy products, such as skim milk or low-fat yogurt. Fill one fourth of your plate at each meal with low-fat (lean) proteins. Low-fat proteins include fish, chicken without skin, eggs, beans, and tofu. Avoid fatty meat, cured and processed meat, or chicken with skin. Avoid pre-made or processed food. Limit the amount of salt in your diet to less than 1,500 mg each day. Do not drink alcohol if: Your doctor tells you not to drink. You are pregnant, may be pregnant, or are planning to become pregnant. If you drink alcohol: Limit how much you have to: 0-1 drink a day for women. 0-2 drinks a day for men. Know how much alcohol is in your drink. In the [place] one drink equals one 12 oz bottle of beer (355 mL), one 5 oz glass of wine (148 mL), or one 1 oz glass of hard liquor (44 mL). Lifestyle  Work with your doctor to stay at a healthy weight or to lose weight. Ask your doctor what the best weight is for you. Get at least 30 minutes of exercise that causes your heart to beat faster (aerobic exercise) most days of the week. This may include walking, swimming, or biking. Get at least 30 minutes of exercise that strengthens your muscles (resistance exercise) at least 3 days a week. This may include lifting weights or doing Pilates. Do not smoke or use any products that contain nicotine or tobacco. If you need help quitting, ask your doctor. Check your blood pressure at home as told by your doctor. Keep all follow-up visits. Medicines Take over-the-counter and prescription medicines   only as told by your doctor. Follow directions carefully. Do not skip doses of blood pressure medicine. The medicine does not work as well if you skip doses. Skipping doses also puts you at risk for problems. Ask your doctor about side effects or reactions to medicines that you should watch  for. Contact a doctor if: You think you are having a reaction to the medicine you are taking. You have headaches that keep coming back. You feel dizzy. You have swelling in your ankles. You have trouble with your vision. Get help right away if: You get a very bad headache. You start to feel mixed up (confused). You feel weak or numb. You feel faint. You have very bad pain in your: Chest. Belly (abdomen). You vomit more than once. You have trouble breathing. These symptoms may be an emergency. Get help right away. Call 911. Do not wait to see if the symptoms will go away. Do not drive yourself to the hospital. Summary Hypertension is another name for high blood pressure. High blood pressure forces your heart to work harder to pump blood. For most people, a normal blood pressure is less than 120/80. Making healthy choices can help lower blood pressure. If your blood pressure does not get lower with healthy choices, you may need to take medicine. This information is not intended to replace advice given to you by your health care provider. Make sure you discuss any questions you have with your health care provider. Document Revised: 09/13/2021 Document Reviewed: 09/13/2021 Elsevier Patient Education  2024 Elsevier Inc.  

## 2023-09-10 NOTE — Progress Notes (Signed)
I,Victoria T Deloria Lair, CMA,acting as a Neurosurgeon for [name], MD.,have documented all relevant documentation on the behalf of [name], MD,as directed by  [name], MD while in the presence of [name], MD.  Subjective:  Patient ID: [name] Gordon , male    DOB: 12-14-1975 , 47 y.o.   MRN: [id number]  Chief Complaint  Patient presents with   Hypertension    HPI  Patient presents today for a bp check, he reports compliance with medication and has no other issues today.  He denies having any headaches, chest pain and shortness of breath. He admits he is under more stress than usual.  He just found out his job is "ending".  He was originally placed on light duty, and has now been advised he is terminated. He is not sure why this happened.   Hypertension This is a chronic problem. The current episode started more than 1 year ago. The problem is unchanged. The problem is controlled. Pertinent negatives include no anxiety. There are no associated agents to hypertension. Risk factors for coronary artery disease include obesity, sedentary lifestyle and male gender. Past treatments include angiotensin blockers, calcium channel blockers and lifestyle changes. There are no compliance problems.      Past Medical History:  Diagnosis Date   Fatty liver 2017   Mild   History of gout    History of kidney stones    Hypertension    Morbid obesity (HCC)    OA (osteoarthritis) of knee    Pre-diabetes    Spinal stenosis 2018     Family History  Problem Relation Age of Onset   Hypertension Mother    Diabetes Mother    Cancer Father      Current Outpatient Medications:    amlodipine-olmesartan (AZOR) 10-20 MG tablet, Take 1 tablet by mouth daily., Disp: 90 tablet, Rfl: 1   cholecalciferol (VITAMIN D3) 25 MCG (1000 UT) tablet, Take 1,000 Units by mouth daily., Disp: , Rfl:    colchicine 0.6 MG tablet, Take 2 tablets by mouth at the onset of gout pain and then 1 tablet in 1  hour. Do not take more than 3 tablets per gout flare up., Disp: 30 tablet, Rfl: 0   ibuprofen (ADVIL) 200 MG tablet, Take 600 mg by mouth every 6 (six) hours as needed (Arthritis)., Disp: , Rfl:    nebivolol (BYSTOLIC) 10 MG tablet, Take 1 tablet (10 mg total) by mouth daily., Disp: 90 tablet, Rfl: 1   Olopatadine HCl 0.2 % SOLN, One gtt in both eyes daily, Disp: 2.5 mL, Rfl: 0   oxyCODONE-acetaminophen (PERCOCET/ROXICET) 5-325 MG tablet, Take 1-2 tablets by mouth every 6 (six) hours as needed for severe pain., Disp: 30 tablet, Rfl: 0   No Known Allergies   Review of Systems  Constitutional: Negative.   HENT: Negative.    Cardiovascular: Negative.   Endocrine: Negative.   Genitourinary: Negative.   Skin: Negative.   Hematological: Negative.      Today's Vitals   09/11/23 0826 09/11/23 0905  BP: (!) 160/90 (!) 146/100  Pulse: 73   Temp: 98.6 F (37 C)   Weight: (!) 396 lb (179.6 kg)   Height: 5' 10.5" (1.791 m)   PainSc: 0-No pain    Body mass index is 56.02 kg/m.  Wt Readings from Last 3 Encounters:  09/12/23 (!) 396 lb (179.6 kg)  09/11/23 (!) 396 lb (179.6 kg)  05/19/23 (!) 395 lb (179.2 kg)  The 10-year ASCVD risk score (Arnett DK, et al., 2019) is: 8.7%   Values used to calculate the score:     Age: 41 years     Sex: Male     Is Non-Hispanic African American: Yes     Diabetic: No     Tobacco smoker: No     Systolic Blood Pressure: 138 mmHg     Is BP treated: Yes     HDL Cholesterol: 56 mg/dL     Total Cholesterol: 192 mg/dL    Objective:  Physical Exam Vitals and nursing note reviewed.  Constitutional:      Appearance: Normal appearance. He is obese.  HENT:     Head: Normocephalic and atraumatic.  Eyes:     Extraocular Movements: Extraocular movements intact.  Cardiovascular:     Rate and Rhythm: Normal rate and regular rhythm.     Heart sounds: Normal heart sounds.  Pulmonary:     Effort: Pulmonary effort is normal.     Breath sounds: Normal  breath sounds.  Musculoskeletal:     Cervical back: Normal range of motion.  Skin:    General: Skin is warm.  Neurological:     General: No focal deficit present.     Mental Status: He is alert.  Psychiatric:        Mood and Affect: Mood normal.         Assessment And Plan:  Essential hypertension, benign Assessment & Plan: Chronic, uncontrolled.  He is under a great deal of stress at this time. I will not change his meds at this time. He will continue with amlodipine/olmesartan 10/20mg  daily and nebivolol 10mg  daily. Importance of medication compliance was discussed with the patient.  Orders: -     BMP8+eGFR  Pure hypercholesterolemia Assessment & Plan: Chronic, we discussed the use of cardiac calcium scoring. He is aware of the $99 fee. I will make further recommendations once his labs are available for review.   Orders: -     CT CARDIAC SCORING (SELF PAY ONLY); Future  Other abnormal glucose Assessment & Plan: Previous labs reviewed, his A1c has been elevated in the past. I will check an A1c today. Reminded to avoid refined sugars including sugary drinks/foods and processed meats including bacon, sausages and deli meats.    Orders: -     Hemoglobin A1c  Work-related stress Assessment & Plan: He agrees to therapy. Referral placed. No need for additional meds at this time.   Orders: -     Ambulatory referral to Psychology  Class 3 severe obesity due to excess calories with serious comorbidity and body mass index (BMI) of 50.0 to 59.9 in adult [facility]) Assessment & Plan: BMI 56. He is encouraged to consider water aerobics and walking in the pool as forms of exercise due to his chronic knee OA. This would be less impact on his joints.    Influenza vaccination declined  COVID-19 vaccination declined     Return in about 4 weeks (around 10/09/2023), or [place] bp check, for 4 month bp check.  Patient was given opportunity to ask questions. Patient verbalized understanding  of the plan and was able to repeat key elements of the plan. All questions were answered to their satisfaction.    I, [name], MD, have reviewed all documentation for this visit. The documentation on 09/11/23 for the exam, diagnosis, procedures, and orders are all accurate and complete.   IF YOU HAVE BEEN REFERRED TO A SPECIALIST, IT MAY TAKE  1-2 WEEKS TO SCHEDULE/PROCESS THE REFERRAL. IF YOU HAVE NOT HEARD FROM US/SPECIALIST IN TWO WEEKS, PLEASE GIVE [place] A CALL AT [phone].   THE PATIENT IS ENCOURAGED TO PRACTICE SOCIAL DISTANCING DUE TO THE COVID-19 PANDEMIC.

## 2023-09-11 ENCOUNTER — Ambulatory Visit: Payer: BC Managed Care – PPO | Admitting: Internal Medicine

## 2023-09-11 ENCOUNTER — Encounter: Payer: Self-pay | Admitting: Internal Medicine

## 2023-09-11 VITALS — BP 146/100 | HR 73 | Temp 98.6°F | Ht 70.5 in | Wt 396.0 lb

## 2023-09-11 DIAGNOSIS — F4321 Adjustment disorder with depressed mood: Secondary | ICD-10-CM

## 2023-09-11 DIAGNOSIS — I10 Essential (primary) hypertension: Secondary | ICD-10-CM

## 2023-09-11 DIAGNOSIS — E7800 Pure hypercholesterolemia, unspecified: Secondary | ICD-10-CM

## 2023-09-11 DIAGNOSIS — Z2821 Immunization not carried out because of patient refusal: Secondary | ICD-10-CM

## 2023-09-11 DIAGNOSIS — R7309 Other abnormal glucose: Secondary | ICD-10-CM

## 2023-09-11 DIAGNOSIS — Z566 Other physical and mental strain related to work: Secondary | ICD-10-CM

## 2023-09-11 DIAGNOSIS — Z6841 Body Mass Index (BMI) 40.0 and over, adult: Secondary | ICD-10-CM

## 2023-09-11 DIAGNOSIS — E66813 Obesity, class 3: Secondary | ICD-10-CM | POA: Diagnosis not present

## 2023-09-11 LAB — BMP8+EGFR
BUN/Creatinine Ratio: 11 (ref 9–20)
BUN: 11 mg/dL (ref 6–24)
CO2: 24 mmol/L (ref 20–29)
Calcium: 9.7 mg/dL (ref 8.7–10.2)
Chloride: 103 mmol/L (ref 96–106)
Creatinine, Ser: 0.99 mg/dL (ref 0.76–1.27)
Glucose: 125 mg/dL — ABNORMAL HIGH (ref 70–99)
Potassium: 5 mmol/L (ref 3.5–5.2)
Sodium: 141 mmol/L (ref 134–144)
eGFR: 94 mL/min/{1.73_m2} (ref >59)

## 2023-09-11 LAB — HEMOGLOBIN A1C
Est. average glucose Bld gHb Est-mCnc: 137 mg/dL
Hgb A1c MFr Bld: 6.4 % — ABNORMAL HIGH (ref 4.8–5.6)

## 2023-09-12 ENCOUNTER — Other Ambulatory Visit: Payer: Self-pay

## 2023-09-12 ENCOUNTER — Ambulatory Visit (HOSPITAL_COMMUNITY): Payer: BC Managed Care – PPO | Admitting: Certified Registered Nurse Anesthetist

## 2023-09-12 ENCOUNTER — Encounter (HOSPITAL_COMMUNITY): Payer: Self-pay | Admitting: Gastroenterology

## 2023-09-12 ENCOUNTER — Encounter (HOSPITAL_COMMUNITY)
Admission: RE | Disposition: A | Discharge status: Home / Self Care | Payer: Self-pay | Source: Home / Self Care | Attending: Gastroenterology

## 2023-09-12 ENCOUNTER — Ambulatory Visit (HOSPITAL_COMMUNITY)
Admission: RE | Admit: 2023-09-12 | Discharge: 2023-09-12 | Disposition: A | Discharge status: Home / Self Care | Payer: BC Managed Care – PPO | Attending: Gastroenterology | Admitting: Gastroenterology

## 2023-09-12 DIAGNOSIS — Z1211 Encounter for screening for malignant neoplasm of colon: Secondary | ICD-10-CM | POA: Insufficient documentation

## 2023-09-12 DIAGNOSIS — Z800 Family history of malignant neoplasm of digestive organs: Secondary | ICD-10-CM | POA: Diagnosis not present

## 2023-09-12 DIAGNOSIS — Z860100 Personal history of colon polyps, unspecified: Secondary | ICD-10-CM | POA: Insufficient documentation

## 2023-09-12 HISTORY — PX: COLONOSCOPY WITH PROPOFOL: SHX5780

## 2023-09-12 HISTORY — PX: BIOPSY: SHX5522

## 2023-09-12 SURGERY — COLONOSCOPY WITH PROPOFOL
Anesthesia: Monitor Anesthesia Care

## 2023-09-12 MED ORDER — DEXMEDETOMIDINE HCL IN NACL 80 MCG/20ML IV SOLN
INTRAVENOUS | Status: DC | PRN
Start: 2023-09-12 — End: 2023-09-12
  Administered 2023-09-12: 12 ug via INTRAVENOUS

## 2023-09-12 MED ORDER — LACTATED RINGERS IV SOLN
INTRAVENOUS | Status: AC | PRN
Start: 2023-09-12 — End: 2023-09-12
  Administered 2023-09-12: 20 mL/h via INTRAVENOUS

## 2023-09-12 MED ORDER — PROPOFOL 500 MG/50ML IV EMUL
INTRAVENOUS | Status: DC | PRN
  Administered 2023-09-12: 100 ug/kg/min via INTRAVENOUS
  Administered 2023-09-12: 60 mg via INTRAVENOUS

## 2023-09-12 SURGICAL SUPPLY — 22 items

## 2023-09-12 NOTE — Anesthesia Preprocedure Evaluation (Signed)
Anesthesia Evaluation  Patient identified by MRN, date of birth, ID band Patient awake    Reviewed: Allergy & Precautions, NPO status , Patient's Chart, lab work & pertinent test results  Airway Mallampati: III  TM Distance: >3 FB Neck ROM: Full    Dental no notable dental hx.    Pulmonary neg pulmonary ROS   Pulmonary exam normal        Cardiovascular hypertension, Pt. on medications and Pt. on home beta blockers  Rhythm:Regular Rate:Normal     Neuro/Psych negative neurological ROS  negative psych ROS   GI/Hepatic Neg liver ROS,,,Screening    Endo/Other    Morbid obesity  Renal/GU negative Renal ROS  negative genitourinary   Musculoskeletal  (+) Arthritis , Osteoarthritis,    Abdominal Normal abdominal exam  (+)   Peds  Hematology   Anesthesia Other Findings   Reproductive/Obstetrics                             Anesthesia Physical Anesthesia Plan  ASA: 3  Anesthesia Plan: MAC   Post-op Pain Management:    Induction:   PONV Risk Score and Plan: 1 and Propofol infusion and Treatment may vary due to age or medical condition  Airway Management Planned: Simple Face Mask and Nasal Cannula  Additional Equipment: None  Intra-op Plan:   Post-operative Plan:   Informed Consent: I have reviewed the patients History and Physical, chart, labs and discussed the procedure including the risks, benefits and alternatives for the proposed anesthesia with the patient or authorized representative who has indicated his/her understanding and acceptance.     Dental advisory given  Plan Discussed with: CRNA  Anesthesia Plan Comments:        Anesthesia Quick Evaluation

## 2023-09-12 NOTE — Anesthesia Postprocedure Evaluation (Signed)
Anesthesia Post Note  Patient: [name] Gordon  Procedure(s) Performed: COLONOSCOPY WITH PROPOFOL BIOPSY     Patient location during evaluation: PACU Anesthesia Type: MAC Level of consciousness: awake and alert Pain management: pain level controlled Vital Signs Assessment: post-procedure vital signs reviewed and stable Respiratory status: spontaneous breathing, nonlabored ventilation, respiratory function stable and patient connected to nasal cannula oxygen Cardiovascular status: stable and blood pressure returned to baseline Postop Assessment: no apparent nausea or vomiting Anesthetic complications: no   No notable events documented.  Last Vitals:  Vitals:   09/12/23 1000 09/12/23 1010  BP: (!) 140/84 (!) 138/91  Pulse: 79 70  Resp: 20 18  Temp:    SpO2: 93% 95%    Last Pain:  Vitals:   09/12/23 1010  TempSrc:   PainSc: 10-Worst pain ever                 Earl Lites P Kaiana Marion

## 2023-09-12 NOTE — Discharge Instructions (Signed)

## 2023-09-12 NOTE — Transfer of Care (Signed)
Immediate Anesthesia Transfer of Care Note  Patient: [name] Gordon  Procedure(s) Performed: Procedure(s): COLONOSCOPY WITH PROPOFOL (N/A) BIOPSY  Patient Location: PACU and Endoscopy Unit  Anesthesia Type:MAC  Level of Consciousness: awake, alert  and oriented  Airway & Oxygen Therapy: Patient Spontanous Breathing and Patient connected to nasal cannula oxygen  Post-op Assessment: Report given to RN and Post -op Vital signs reviewed and stable  Post vital signs: Reviewed and stable  Last Vitals:  Vitals:   09/12/23 0912  BP: (!) 176/85  Pulse: 82  Resp: 19  Temp: (!) 36.4 C  SpO2: 97%    Complications: No apparent anesthesia complications

## 2023-09-12 NOTE — H&P (Signed)
[name] Gordon HPI: At this time the patient denies any problems with nausea, vomiting, fevers, chills, abdominal pain, diarrhea, constipation, hematochezia, melena, GERD, or dysphagia. The patient's sister, at the age of 65, was diagnosed with colon cancer. No complaints of chest pain, SOB, MI, or sleep apnea.  His colonoscopy in 2020 was positive for 3 adenomas  Past Medical History:  Diagnosis Date   Fatty liver 2017   Mild   History of gout    History of kidney stones    Hypertension    Morbid obesity (HCC)    OA (osteoarthritis) of knee    Pre-diabetes    Spinal stenosis 2018    Past Surgical History:  Procedure Laterality Date   ARTHROSCOPY KNEE W/ DRILLING     COLONOSCOPY WITH PROPOFOL N/A 06/01/2019   Procedure: COLONOSCOPY WITH PROPOFOL;  Surgeon: Jeani Hawking, MD;  Location: [facility];  Service: Endoscopy;  Laterality: N/A;   POLYPECTOMY  06/01/2019   Procedure: POLYPECTOMY;  Surgeon: Jeani Hawking, MD;  Location: [facility];  Service: Endoscopy;;   SHOULDER ARTHROSCOPY WITH ROTATOR CUFF REPAIR Left 05/19/2023   Procedure: SHOULDER ARTHROSCOPY WITH MINI OPEN ROTATOR CUFF REPAIR, BICEPS TENODESIS, DEBRIDEMENT, DISTAL CLAVICLE RESECTION;  Surgeon: Jodi Geralds, MD;  Location: [facility];  Service: Orthopedics;  Laterality: Left;    Family History  Problem Relation Age of Onset   Hypertension Mother    Diabetes Mother    Cancer Father     Social History:  reports that he has never smoked. He has never used smokeless tobacco. He reports current alcohol use. He reports that he does not use drugs.  Allergies: No Known Allergies  Medications: Scheduled: Continuous:  Results for orders placed or performed in visit on 09/11/23 (from the past 24 hour(s))  Hemoglobin A1c     Status: Abnormal   Collection Time: 09/11/23  9:32 AM  Result Value Ref Range   Hgb A1c MFr Bld 6.4 (H) 4.8 - 5.6 %   Est. average glucose Bld gHb Est-mCnc 137 mg/dL   Narrative   Performed at:  [address] [place], [place]  [id number] Lab Director: Jolene Schimke MD, Phone:  [phone]  BMP8+eGFR     Status: Abnormal   Collection Time: 09/11/23  9:32 AM  Result Value Ref Range   Glucose 125 (H) 70 - 99 mg/dL   BUN 11 6 - 24 mg/dL   Creatinine, Ser 8.29 0.76 - 1.27 mg/dL   eGFR 94 >56 OZ/HYQ/6.57   BUN/Creatinine Ratio 11 9 - 20   Sodium 141 134 - 144 mmol/L   Potassium 5.0 3.5 - 5.2 mmol/L   Chloride 103 96 - 106 mmol/L   CO2 24 20 - 29 mmol/L   Calcium 9.7 8.7 - 10.2 mg/dL   Narrative   Performed at:  [address] [place] [address] [place], [place]  [id number] Lab Director: Jolene Schimke MD, Phone:  [phone]     No results found.  ROS:  As stated above in the HPI otherwise negative.  Blood pressure (!) 176/85, pulse 82, temperature (!) 97.5 F (36.4 C), temperature source Temporal, resp. rate 19, height 5\' 10"  (1.778 m), weight (!) 179.6 kg, SpO2 97%.    PE: Gen: NAD, Alert and Oriented HEENT:  [place]/AT, EOMI Neck: Supple, no LAD Lungs: CTA Bilaterally CV: RRR without M/G/R ABD: Soft, NTND, +BS Ext: No C/C/E  Assessment/Plan: 1) Screening. 2) Family history of colon cancer.  Plan: 1) Colonoscopy.  Akeisha Lagerquist D 09/12/2023,  9:14 AM

## 2023-09-12 NOTE — Op Note (Signed)
[facility] Patient Name: [name] Gordon Procedure Date: 09/12/2023 MRN: [id number] Attending MD: Jeani Hawking , MD, [id number] Date of Birth: 22-Aug-1976 CSN: [id number] Age: 47 Admit Type: Outpatient Procedure:                Colonoscopy Indications:              Screening for colorectal malignant neoplasm Providers:                Jeani Hawking, MD, Suzy Bouchard, RN, Norman Clay, RN,                            Kandice Robinsons, Technician, Geoffery Lyons,                            Technician Referring MD:              Medicines:                Propofol per Anesthesia Complications:            No immediate complications. Estimated Blood Loss:     Estimated blood loss: none. Procedure:                Pre-Anesthesia Assessment:                           - Prior to the procedure, a History and Physical                            was performed, and patient medications and                            allergies were reviewed. The patient's tolerance of                            previous anesthesia was also reviewed. The risks                            and benefits of the procedure and the sedation                            options and risks were discussed with the patient.                            All questions were answered, and informed consent                            was obtained. Prior Anticoagulants: The patient has                            taken no anticoagulant or antiplatelet agents. ASA                            Grade Assessment: III - A patient with severe  systemic disease. After reviewing the risks and                            benefits, the patient was deemed in satisfactory                            condition to undergo the procedure.                           - Sedation was administered by an anesthesia                            professional. Deep sedation was attained.                           After obtaining informed  consent, the colonoscope                            was passed under direct vision. Throughout the                            procedure, the patient's blood pressure, pulse, and                            oxygen saturations were monitored continuously. The                            CF-HQ190L ([id number]) Olympus colonoscope was                            introduced through the anus and advanced to the the                            terminal ileum. The colonoscopy was performed                            without difficulty. The patient tolerated the                            procedure well. The quality of the bowel                            preparation was evaluated using the BBPS [facility]                            Bowel Preparation Scale) with scores of: Right                            Colon = 3, Transverse Colon = 3 and Left Colon = 3                            (entire mucosa seen well with no residual staining,  small fragments of stool or opaque liquid). The                            total BBPS score equals 9. The terminal ileum,                            ileocecal valve, appendiceal orifice, and rectum                            were photographed. Scope In: 9:37:56 AM Scope Out: 9:46:56 AM Scope Withdrawal Time: 0 hours 7 minutes 27 seconds  Total Procedure Duration: 0 hours 9 minutes 0 seconds  Findings:      The terminal ileum appeared normal. Biopsies were taken with a cold       forceps for histology.      The entire examined colon appeared normal.      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 Cold biopsies were obtained       to ensure no true underlying pathology. Impression:               - The examined portion of the ileum was normal.                            Biopsied.                           - The entire examined colon is normal. Moderate Sedation:      Not  Applicable - Patient had care per Anesthesia. Recommendation:           - Patient has a contact number available for                            emergencies. The signs and symptoms of potential                            delayed complications were discussed with the                            patient. Return to normal activities tomorrow.                            Written discharge instructions were provided to the                            patient.                           - Resume previous diet.                           - Continue present medications.                           - Await pathology results.                           -  Repeat colonoscopy in 5 years for surveillance.                            His older sister had colon cancer at the age of 78. Procedure Code(s):        --- Professional ---                           [phone], Colonoscopy, flexible; with biopsy, single                            or multiple Diagnosis Code(s):        --- Professional ---                           Z12.11, Encounter for screening for malignant                            neoplasm of colon CPT copyright 2022 American Medical Association. All rights reserved. The codes documented in this report are preliminary and upon coder review may  be revised to meet current compliance requirements. Jeani Hawking, MD Jeani Hawking, MD 09/12/2023 9:55:49 AM This report has been signed electronically. Number of Addenda: 0

## 2023-09-15 ENCOUNTER — Encounter (HOSPITAL_COMMUNITY): Payer: Self-pay | Admitting: Gastroenterology

## 2023-09-15 DIAGNOSIS — E7800 Pure hypercholesterolemia, unspecified: Secondary | ICD-10-CM | POA: Insufficient documentation

## 2023-09-15 DIAGNOSIS — R7309 Other abnormal glucose: Secondary | ICD-10-CM | POA: Insufficient documentation

## 2023-09-15 DIAGNOSIS — Z566 Other physical and mental strain related to work: Secondary | ICD-10-CM | POA: Insufficient documentation

## 2023-09-15 LAB — SURGICAL PATHOLOGY

## 2023-09-15 NOTE — Assessment & Plan Note (Signed)
Chronic, we discussed the use of cardiac calcium scoring. He is aware of the $99 fee. I will make further recommendations once his labs are available for review.

## 2023-09-15 NOTE — Assessment & Plan Note (Signed)
He agrees to therapy. Referral placed. No need for additional meds at this time.

## 2023-09-15 NOTE — Assessment & Plan Note (Signed)
Previous labs reviewed, his A1c has been elevated in the past. I will check an A1c today. Reminded to avoid refined sugars including sugary drinks/foods and processed meats including bacon, sausages and deli meats.     

## 2023-09-15 NOTE — Assessment & Plan Note (Signed)
BMI 56. He is encouraged to consider water aerobics and walking in the pool as forms of exercise due to his chronic knee OA. This would be less impact on his joints.

## 2023-09-15 NOTE — Assessment & Plan Note (Signed)
Chronic, uncontrolled.  He is under a great deal of stress at this time. I will not change his meds at this time. He will continue with amlodipine/olmesartan 10/20mg  daily and nebivolol 10mg  daily. Importance of medication compliance was discussed with the patient.

## 2023-10-07 ENCOUNTER — Ambulatory Visit: Payer: Self-pay

## 2023-10-07 VITALS — BP 124/90 | HR 78 | Temp 98.4°F | Ht 70.0 in | Wt 396.0 lb

## 2023-10-07 DIAGNOSIS — I10 Essential (primary) hypertension: Secondary | ICD-10-CM

## 2023-10-07 NOTE — Progress Notes (Unsigned)
Patient presents today for a bp check. Patient reports he is taking azor 10-20mg  in the mornings.he takes the bystolic 10mg  in the afternoons. I checked his bp today and it was 134/80 P78. I had patient wait 10 minutes and rechecked and it was 124/90 P74. After speaking with provider she advised for patient to increase his bystolic to 20mg  and follow up with [place] in 3 weeks [place] bpc. Meds sent to pharmacy.    BP Readings from Last 3 Encounters:  09/12/23 (!) 138/91  09/11/23 (!) 146/100  05/19/23 (!) 142/95

## 2023-10-08 MED ORDER — NEBIVOLOL HCL 20 MG PO TABS: 20.0000 mg | ORAL_TABLET | Freq: Every day | ORAL | 0 refills | Status: DC

## 2023-10-09 ENCOUNTER — Ambulatory Visit (HOSPITAL_BASED_OUTPATIENT_CLINIC_OR_DEPARTMENT_OTHER): Payer: BC Managed Care – PPO | Attending: Internal Medicine

## 2023-10-28 ENCOUNTER — Ambulatory Visit: Payer: BC Managed Care – PPO

## 2023-10-28 ENCOUNTER — Other Ambulatory Visit: Payer: Self-pay

## 2023-10-28 VITALS — BP 140/100 | HR 79 | Temp 98.1°F | Ht 70.0 in | Wt 396.0 lb

## 2023-10-28 DIAGNOSIS — I10 Essential (primary) hypertension: Secondary | ICD-10-CM

## 2023-10-28 MED ORDER — NEBIVOLOL HCL 20 MG PO TABS: 20.0000 mg | ORAL_TABLET | Freq: Every day | ORAL | 1 refills | Status: DC

## 2023-10-28 NOTE — Progress Notes (Signed)
Patient presents today for bpc. He currently takes amlodipine-olmesartan 10-20MG  he takes in the morning. Along with Bystolic 10MG  he reports taking at night. He admits not picking up the 20MG  Bystolic dose yet. He is still currently taking the 10MG . Denies headache, chest pain & sob. Initial bp: 138/98. Bp taken again after 10 minutes:  BP Readings from Last 3 Encounters:  10/28/23 (!) 140/100  10/07/23 (!) 124/90  09/12/23 (!) 138/91  Per provider patient is to take 2 of the 10MG  Bystolic. Prescription sent for 20MG  when patient completes what he has on hand. He is to come back in 2 weeks for bpc. Appointment scheduled. Patient aware.

## 2023-10-28 NOTE — Patient Instructions (Signed)
Hypertension, Adult Hypertension is another name for high blood pressure. High blood pressure forces your heart to work harder to pump blood. This can cause problems over time. There are two numbers in a blood pressure reading. There is a top number (systolic) over a bottom number (diastolic). It is best to have a blood pressure that is below 120/80. What are the causes? The cause of this condition is not known. Some other conditions can lead to high blood pressure. What increases the risk? Some lifestyle factors can make you more likely to develop high blood pressure: Smoking. Not getting enough exercise or physical activity. Being overweight. Having too much fat, sugar, calories, or salt (sodium) in your diet. Drinking too much alcohol. Other risk factors include: Having any of these conditions: Heart disease. Diabetes. High cholesterol. Kidney disease. Obstructive sleep apnea. Having a family history of high blood pressure and high cholesterol. Age. The risk increases with age. Stress. What are the signs or symptoms? High blood pressure may not cause symptoms. Very high blood pressure (hypertensive crisis) may cause: Headache. Fast or uneven heartbeats (palpitations). Shortness of breath. Nosebleed. Vomiting or feeling like you may vomit (nauseous). Changes in how you see. Very bad chest pain. Feeling dizzy. Seizures. How is this treated? This condition is treated by making healthy lifestyle changes, such as: Eating healthy foods. Exercising more. Drinking less alcohol. Your doctor may prescribe medicine if lifestyle changes do not help enough and if: Your top number is above 130. Your bottom number is above 80. Your personal target blood pressure may vary. Follow these instructions at home: Eating and drinking  If told, follow the DASH eating plan. To follow this plan: Fill one half of your plate at each meal with fruits and vegetables. Fill one fourth of your plate  at each meal with whole grains. Whole grains include whole-wheat pasta, brown rice, and whole-grain bread. Eat or drink low-fat dairy products, such as skim milk or low-fat yogurt. Fill one fourth of your plate at each meal with low-fat (lean) proteins. Low-fat proteins include fish, chicken without skin, eggs, beans, and tofu. Avoid fatty meat, cured and processed meat, or chicken with skin. Avoid pre-made or processed food. Limit the amount of salt in your diet to less than 1,500 mg each day. Do not drink alcohol if: Your doctor tells you not to drink. You are pregnant, may be pregnant, or are planning to become pregnant. If you drink alcohol: Limit how much you have to: 0-1 drink a day for women. 0-2 drinks a day for men. Know how much alcohol is in your drink. In the [place] one drink equals one 12 oz bottle of beer (355 mL), one 5 oz glass of wine (148 mL), or one 1 oz glass of hard liquor (44 mL). Lifestyle  Work with your doctor to stay at a healthy weight or to lose weight. Ask your doctor what the best weight is for you. Get at least 30 minutes of exercise that causes your heart to beat faster (aerobic exercise) most days of the week. This may include walking, swimming, or biking. Get at least 30 minutes of exercise that strengthens your muscles (resistance exercise) at least 3 days a week. This may include lifting weights or doing Pilates. Do not smoke or use any products that contain nicotine or tobacco. If you need help quitting, ask your doctor. Check your blood pressure at home as told by your doctor. Keep all follow-up visits. Medicines Take over-the-counter and prescription medicines   only as told by your doctor. Follow directions carefully. Do not skip doses of blood pressure medicine. The medicine does not work as well if you skip doses. Skipping doses also puts you at risk for problems. Ask your doctor about side effects or reactions to medicines that you should watch  for. Contact a doctor if: You think you are having a reaction to the medicine you are taking. You have headaches that keep coming back. You feel dizzy. You have swelling in your ankles. You have trouble with your vision. Get help right away if: You get a very bad headache. You start to feel mixed up (confused). You feel weak or numb. You feel faint. You have very bad pain in your: Chest. Belly (abdomen). You vomit more than once. You have trouble breathing. These symptoms may be an emergency. Get help right away. Call 911. Do not wait to see if the symptoms will go away. Do not drive yourself to the hospital. Summary Hypertension is another name for high blood pressure. High blood pressure forces your heart to work harder to pump blood. For most people, a normal blood pressure is less than 120/80. Making healthy choices can help lower blood pressure. If your blood pressure does not get lower with healthy choices, you may need to take medicine. This information is not intended to replace advice given to you by your health care provider. Make sure you discuss any questions you have with your health care provider. Document Revised: 09/13/2021 Document Reviewed: 09/13/2021 Elsevier Patient Education  2024 Elsevier Inc.  

## 2023-11-13 ENCOUNTER — Ambulatory Visit: Payer: BC Managed Care – PPO | Admitting: Family Medicine

## 2023-11-13 ENCOUNTER — Encounter: Payer: Self-pay | Admitting: Family Medicine

## 2023-11-13 VITALS — BP 132/90 | HR 89 | Temp 99.2°F | Ht 70.0 in | Wt >= 6400 oz

## 2023-11-13 DIAGNOSIS — R739 Hyperglycemia, unspecified: Secondary | ICD-10-CM

## 2023-11-13 DIAGNOSIS — Z6841 Body Mass Index (BMI) 40.0 and over, adult: Secondary | ICD-10-CM

## 2023-11-13 DIAGNOSIS — E66813 Obesity, class 3: Secondary | ICD-10-CM

## 2023-11-13 DIAGNOSIS — F1520 Other stimulant dependence, uncomplicated: Secondary | ICD-10-CM | POA: Diagnosis not present

## 2023-11-13 DIAGNOSIS — I10 Essential (primary) hypertension: Secondary | ICD-10-CM | POA: Diagnosis not present

## 2023-11-13 MED ORDER — HYDROCHLOROTHIAZIDE 12.5 MG PO TABS
12.5000 mg | ORAL_TABLET | ORAL | 2 refills | Status: DC
Start: 2023-11-13 — End: 2024-01-19

## 2023-11-13 MED ORDER — HYDROCHLOROTHIAZIDE 12.5 MG PO TABS: 12.5000 mg | ORAL_TABLET | Freq: Every day | ORAL | 2 refills | Status: DC

## 2023-11-13 MED ORDER — MOUNJARO 2.5 MG/0.5ML ~~LOC~~ SOAJ
2.5000 mg | SUBCUTANEOUS | 1 refills | Status: DC
Start: 2023-11-13 — End: 2024-01-19

## 2023-11-13 NOTE — Assessment & Plan Note (Signed)
He is encouraged to strive for BMI less than 30 to decrease cardiac risk. Advised to aim for at least 150 minutes of exercise per week.  

## 2023-11-13 NOTE — Progress Notes (Signed)
I,Jameka J Llittleton, CMA,acting as a Neurosurgeon for [employer], NP.,have documented all relevant documentation on the behalf of Ellender Hose, NP,as directed by  Ellender Hose, NP while in the presence of Ellender Hose, NP.  Subjective:  Patient ID: [name] Gordon , male    DOB: 1976/03/13 , 47 y.o.   MRN: [id number]  Chief Complaint  Patient presents with   Hypertension    HPI  Patient presents today for hypertension management. He is currently on Amlodipine - Olmesartan 10-20 mg every day, Bystolic 20 mg every day. He reports compliance with medication and has no other issues today.  He denies having any headaches, chest pain and shortness of breath.Patient states he has improved greatly on his caffeine dependence, he has minimized his coffee and cola drinks intakes. Blood pressure today is not within normal limit, will add a diuretic,hydrochlorothiazide 12.5 mg every day  to treatment regime and also add Mounjaro 2.5 mg/ml injection  SQ every 7 days to help patient with weight loss.  Patient was excited about the idea because he states he has tried dieting for months and different forms of exercising but it has not yielded much result.     Past Medical History:  Diagnosis Date   Fatty liver 2017   Mild   History of gout    History of kidney stones    Hypertension    Morbid obesity (HCC)    OA (osteoarthritis) of knee    Pre-diabetes    Spinal stenosis 2018     Family History  Problem Relation Age of Onset   Hypertension Mother    Diabetes Mother    Cancer Father      Current Outpatient Medications:    amlodipine-olmesartan (AZOR) 10-20 MG tablet, Take 1 tablet by mouth daily., Disp: 90 tablet, Rfl: 1   cholecalciferol (VITAMIN D3) 25 MCG (1000 UT) tablet, Take 1,000 Units by mouth daily., Disp: , Rfl:    colchicine 0.6 MG tablet, Take 2 tablets by mouth at the onset of gout pain and then 1 tablet in 1 hour. Do not take more than 3 tablets per gout flare up., Disp: 30 tablet,  Rfl: 0   hydrochlorothiazide (HYDRODIURIL) 12.5 MG tablet, Take 1 tablet (12.5 mg total) by mouth as directed. Take once daily in the morning., Disp: 30 tablet, Rfl: 2   ibuprofen (ADVIL) 200 MG tablet, Take 600 mg by mouth every 6 (six) hours as needed (Arthritis)., Disp: , Rfl:    Nebivolol HCl (BYSTOLIC) 20 MG TABS, Take 1 tablet (20 mg total) by mouth daily at 6 (six) AM., Disp: 90 tablet, Rfl: 1   Olopatadine HCl 0.2 % SOLN, One gtt in both eyes daily, Disp: 2.5 mL, Rfl: 0   oxyCODONE-acetaminophen (PERCOCET/ROXICET) 5-325 MG tablet, Take 1-2 tablets by mouth every 6 (six) hours as needed for severe pain., Disp: 30 tablet, Rfl: 0   tirzepatide (MOUNJARO) 2.5 MG/0.5ML Pen, Inject 2.5 mg into the skin once a week., Disp: 2 mL, Rfl: 1   No Known Allergies   Review of Systems  Constitutional: Negative.   Eyes: Negative.   Genitourinary: Negative.   Musculoskeletal: Negative.   Skin: Negative.   Psychiatric/Behavioral: Negative.       Today's Vitals   11/13/23 0902 11/13/23 0947  BP: (!) 140/90 (!) 132/90  Pulse: 89   Temp: 99.2 F (37.3 C)   Weight: (!) 402 lb (182.3 kg)   Height: 5\' 10"  (1.778 m)   PainSc: 0-No pain  Body mass index is 57.68 kg/m.  Wt Readings from Last 3 Encounters:  11/13/23 (!) 402 lb (182.3 kg)  10/28/23 (!) 396 lb (179.6 kg)  10/07/23 (!) 396 lb (179.6 kg)    The 10-year ASCVD risk score (Arnett DK, et al., 2019) is: 8.5%   Values used to calculate the score:     Age: 5 years     Sex: Male     Is Non-Hispanic African American: Yes     Diabetic: No     Tobacco smoker: No     Systolic Blood Pressure: 132 mmHg     Is BP treated: Yes     HDL Cholesterol: 56 mg/dL     Total Cholesterol: 192 mg/dL  Objective:  Physical Exam Cardiovascular:     Rate and Rhythm: Normal rate.  Pulmonary:     Effort: Pulmonary effort is normal.  Musculoskeletal:        General: Tenderness and signs of injury present.  Skin:    General: Skin is warm and dry.   Neurological:     Mental Status: He is alert and oriented to person, place, and time.         Assessment And Plan:  Essential hypertension, benign -     hydroCHLOROthiazide; Take 1 tablet (12.5 mg total) by mouth as directed. Take once daily in the morning.  Dispense: 30 tablet; Refill: 2  Caffeine dependence, continuous (HCC)  Hyperglycemia -     Mounjaro; Inject 2.5 mg into the skin once a week.  Dispense: 2 mL; Refill: 1  Class 3 severe obesity due to excess calories with serious comorbidity and body mass index (BMI) of 50.0 to 59.9 in adult [facility]) Assessment & Plan: He is encouraged to strive for BMI less than 30 to decrease cardiac risk. Advised to aim for at least 150 minutes of exercise per week.      Return if symptoms worsen or fail to improve, for Follow up in 2 weeks BMP (labs).  Patient was given opportunity to ask questions. Patient verbalized understanding of the plan and was able to repeat key elements of the plan. All questions were answered to their satisfaction.    I, Ellender Hose, NP, have reviewed all documentation for this visit. The documentation on 11/19/23 for the exam, diagnosis, procedures, and orders are all accurate and complete.   IF YOU HAVE BEEN REFERRED TO A SPECIALIST, IT MAY TAKE 1-2 WEEKS TO SCHEDULE/PROCESS THE REFERRAL. IF YOU HAVE NOT HEARD FROM US/SPECIALIST IN TWO WEEKS, PLEASE GIVE [place] A CALL AT [phone].

## 2023-11-27 ENCOUNTER — Other Ambulatory Visit: Payer: BC Managed Care – PPO

## 2023-11-27 DIAGNOSIS — I10 Essential (primary) hypertension: Secondary | ICD-10-CM

## 2023-11-27 LAB — BMP8+EGFR
BUN/Creatinine Ratio: 11 (ref 9–20)
BUN: 10 mg/dL (ref 6–24)
CO2: 23 mmol/L (ref 20–29)
Calcium: 9.2 mg/dL (ref 8.7–10.2)
Chloride: 103 mmol/L (ref 96–106)
Creatinine, Ser: 0.88 mg/dL (ref 0.76–1.27)
Glucose: 135 mg/dL — ABNORMAL HIGH (ref 70–99)
Potassium: 4.7 mmol/L (ref 3.5–5.2)
Sodium: 142 mmol/L (ref 134–144)
eGFR: 105 mL/min/{1.73_m2} (ref >59)

## 2023-12-22 NOTE — Progress Notes (Signed)
 Office Visit Note   Patient: [name] Gordon           Date of Birth: 1976/08/08           MRN: [id number] Visit Date: 12/23/2023              Requested by: Jarold Medici, MD [address] [place],  [place] [address] PCP: Jarold Medici, MD   Assessment & Plan: Visit Diagnoses:  1. Primary osteoarthritis of right knee   2. Primary osteoarthritis of left knee   3. Body mass index 50.0-59.9, adult (HCC)   4. Morbid obesity (HCC)     Plan: Mathieu is a very pleasant 48 year old gentleman with severe varus DJD.  Cortisone injections repeated today.  We had a long discussion about the importance of losing weight as I anticipate that these injections will become ineffective in the near future.  Realistically I think it will take about a year to 2 years before he can lose the amount of weight he needs to in order to be a surgical candidate.  The patient meets the AMA guidelines for Morbid (severe) obesity with a BMI > 40.0 and I have recommended weight loss.   Follow-Up Instructions: No follow-ups on file.   Orders:  Orders Placed This Encounter  Procedures   Large Joint Inj   XR Knee 1-2 Views Left   XR Knee 1-2 Views Right   No orders of the defined types were placed in this encounter.     Procedures: Large Joint Inj: bilateral knee on 12/23/2023 3:28 PM Indications: pain Details: 22 G needle  Arthrogram: No  Medications (Right): 2 mL lidocaine  1 %; 2 mL bupivacaine  0.5 %; 40 mg methylPREDNISolone  acetate 40 MG/ML Medications (Left): 2 mL lidocaine  1 %; 2 mL bupivacaine  0.5 %; 40 mg methylPREDNISolone  acetate 40 MG/ML Outcome: tolerated well, no immediate complications Patient was prepped and draped in the usual sterile fashion.       Clinical Data: No additional findings.   Subjective: Chief Complaint  Patient presents with   Left Knee - Follow-up   Right Knee - Follow-up    HPI Patient returns today for follow up evaluation for bilateral  knee DJD.  Unfortunately has gained a little bit of weight.  Review of Systems  Constitutional: Negative.   HENT: Negative.    Eyes: Negative.   Respiratory: Negative.    Cardiovascular: Negative.   Gastrointestinal: Negative.   Endocrine: Negative.   Genitourinary: Negative.   Skin: Negative.   Allergic/Immunologic: Negative.   Neurological: Negative.   Hematological: Negative.   Psychiatric/Behavioral: Negative.    All other systems reviewed and are negative.    Objective: Vital Signs: There were no vitals taken for this visit.  Physical Exam Vitals and nursing note reviewed.  Constitutional:      Appearance: He is well-developed.  Pulmonary:     Effort: Pulmonary effort is normal.  Abdominal:     Palpations: Abdomen is soft.  Skin:    General: Skin is warm.  Neurological:     Mental Status: He is alert and oriented to person, place, and time.  Psychiatric:        Behavior: Behavior normal.        Thought Content: Thought content normal.        Judgment: Judgment normal.    Ortho Exam Exams of both knees is unchanged.  Specialty Comments:  No specialty comments available.  Imaging: XR Knee 1-2 Views Right Result Date: 12/23/2023 X-rays  of the right knee show advanced degenerative joint disease with varus deformity.  Bone-on-bone joint space narrowing of the medial  XR Knee 1-2 Views Left Result Date: 12/23/2023 X-rays of the left knee show advanced degenerative joint disease with joint space narrowing of the medial compartment with a lateral tibial subluxation.  Posterior medial wear of the medial tibial plateau.    PMFS History: Patient Active Problem List   Diagnosis Date Noted   Hyperglycemia 11/13/2023   Pure hypercholesterolemia 09/15/2023   Other abnormal glucose 09/15/2023   Work-related stress 09/15/2023   Rotator cuff tear, left 05/19/2023   Arthritis of left acromioclavicular joint 05/19/2023   Traumatic partial tear of left biceps tendon  05/19/2023   Strep throat 04/01/2022   Tonsillitis 04/01/2022   At risk for extreme obesity with alveolar hypoventilation 02/12/2021   At risk for central sleep apnea 02/12/2021   Excessive daytime sleepiness 02/12/2021   Insufficient sleep syndrome 02/12/2021   Circadian rhythm sleep disorder, 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   Loud snoring 02/12/2021   Chronically on opiate therapy 02/12/2021   Caffeine dependence, continuous (HCC) 02/12/2021   RLS (restless legs syndrome) 02/12/2021   Essential hypertension, benign 08/08/2020   Class 3 severe obesity due to excess calories with serious comorbidity and body mass index (BMI) of 50.0 to 59.9 in adult [facility]) 01/04/2020   Onychomycosis 10/25/2019   Past Medical History:  Diagnosis Date   Fatty liver 2017   Mild   History of gout    History of kidney stones    Hypertension    Morbid obesity (HCC)    OA (osteoarthritis) of knee    Pre-diabetes    Spinal stenosis 2018    Family History  Problem Relation Age of Onset   Hypertension Mother    Diabetes Mother    Cancer Father     Past Surgical History:  Procedure Laterality Date   ARTHROSCOPY KNEE W/ DRILLING     BIOPSY  09/12/2023   Procedure: BIOPSY;  Surgeon: Rollin Dover, MD;  Location: [facility];  Service: Gastroenterology;;   COLONOSCOPY WITH PROPOFOL  N/A 06/01/2019   Procedure: COLONOSCOPY WITH PROPOFOL ;  Surgeon: Rollin Dover, MD;  Location: [facility];  Service: Endoscopy;  Laterality: N/A;   COLONOSCOPY WITH PROPOFOL  N/A 09/12/2023   Procedure: COLONOSCOPY WITH PROPOFOL ;  Surgeon: Rollin Dover, MD;  Location: [facility];  Service: Gastroenterology;  Laterality: N/A;   POLYPECTOMY  06/01/2019   Procedure: POLYPECTOMY;  Surgeon: Rollin Dover, MD;  Location: [facility];  Service: Endoscopy;;   SHOULDER ARTHROSCOPY WITH ROTATOR CUFF REPAIR Left 05/19/2023   Procedure: SHOULDER ARTHROSCOPY WITH MINI OPEN ROTATOR CUFF REPAIR, BICEPS TENODESIS, DEBRIDEMENT, DISTAL CLAVICLE  RESECTION;  Surgeon: Yvone Rush, MD;  Location: [facility];  Service: Orthopedics;  Laterality: Left;   Social History   Occupational History   Not on file  Tobacco Use   Smoking status: Never   Smokeless tobacco: Never  Vaping Use   Vaping status: Never Used  Substance and Sexual Activity   Alcohol use: Yes    Comment: 2 days per week   Drug use: No   Sexual activity: Yes    Birth control/protection: None

## 2023-12-23 ENCOUNTER — Ambulatory Visit: Payer: Self-pay | Admitting: Orthopaedic Surgery

## 2023-12-23 ENCOUNTER — Other Ambulatory Visit (INDEPENDENT_AMBULATORY_CARE_PROVIDER_SITE_OTHER): Payer: Self-pay

## 2023-12-23 DIAGNOSIS — M170 Bilateral primary osteoarthritis of knee: Secondary | ICD-10-CM | POA: Diagnosis not present

## 2023-12-23 DIAGNOSIS — M1712 Unilateral primary osteoarthritis, left knee: Secondary | ICD-10-CM

## 2023-12-23 DIAGNOSIS — M1711 Unilateral primary osteoarthritis, right knee: Secondary | ICD-10-CM

## 2023-12-23 DIAGNOSIS — Z6841 Body Mass Index (BMI) 40.0 and over, adult: Secondary | ICD-10-CM

## 2023-12-23 MED ORDER — METHYLPREDNISOLONE ACETATE 40 MG/ML IJ SUSP
40.0000 mg | INTRAMUSCULAR | Status: AC | PRN
Start: 2023-12-23 — End: 2023-12-23
  Administered 2023-12-23: 40 mg via INTRA_ARTICULAR

## 2023-12-23 MED ORDER — BUPIVACAINE HCL 0.5 % IJ SOLN
2.0000 mL | INTRAMUSCULAR | Status: AC | PRN
Start: 2023-12-23 — End: 2023-12-23
  Administered 2023-12-23: 2 mL via INTRA_ARTICULAR

## 2023-12-23 MED ORDER — LIDOCAINE HCL 1 % IJ SOLN
2.0000 mL | INTRAMUSCULAR | Status: AC | PRN
Start: 2023-12-23 — End: 2023-12-23
  Administered 2023-12-23: 2 mL

## 2024-01-13 ENCOUNTER — Ambulatory Visit: Payer: Self-pay | Admitting: Internal Medicine

## 2024-01-19 ENCOUNTER — Encounter: Payer: Self-pay | Admitting: Internal Medicine

## 2024-01-19 ENCOUNTER — Ambulatory Visit: Payer: 59 | Admitting: Internal Medicine

## 2024-01-19 VITALS — BP 128/82 | HR 92 | Temp 98.6°F | Ht 70.0 in | Wt >= 6400 oz

## 2024-01-19 DIAGNOSIS — Z6841 Body Mass Index (BMI) 40.0 and over, adult: Secondary | ICD-10-CM

## 2024-01-19 DIAGNOSIS — R7309 Other abnormal glucose: Secondary | ICD-10-CM

## 2024-01-19 DIAGNOSIS — E66813 Obesity, class 3: Secondary | ICD-10-CM | POA: Diagnosis not present

## 2024-01-19 DIAGNOSIS — I10 Essential (primary) hypertension: Secondary | ICD-10-CM | POA: Diagnosis not present

## 2024-01-19 DIAGNOSIS — J0110 Acute frontal sinusitis, unspecified: Secondary | ICD-10-CM

## 2024-01-19 DIAGNOSIS — Z79899 Other long term (current) drug therapy: Secondary | ICD-10-CM

## 2024-01-19 DIAGNOSIS — Z809 Family history of malignant neoplasm, unspecified: Secondary | ICD-10-CM

## 2024-01-19 MED ORDER — AMOXICILLIN-POT CLAVULANATE 875-125 MG PO TABS
1.0000 | ORAL_TABLET | Freq: Two times a day (BID) | ORAL | 0 refills | Status: DC
Start: 2024-01-19 — End: 2024-04-05

## 2024-01-19 MED ORDER — MOUNJARO 2.5 MG/0.5ML ~~LOC~~ SOAJ: 2.5000 mg | SUBCUTANEOUS | 1 refills | Status: DC

## 2024-01-19 MED ORDER — AMLODIPINE-OLMESARTAN 10-20 MG PO TABS
1.0000 | ORAL_TABLET | Freq: Every day | ORAL | 1 refills | Status: DC
Start: 2024-01-19 — End: 2024-04-19

## 2024-01-19 MED ORDER — NEBIVOLOL HCL 20 MG PO TABS
20.0000 mg | ORAL_TABLET | Freq: Every day | ORAL | 1 refills | Status: DC
Start: 2024-01-19 — End: 2024-05-10

## 2024-01-19 NOTE — Progress Notes (Signed)
I,Victoria T Deloria Lair, CMA,acting as a Neurosurgeon for [name], MD.,have documented all relevant documentation on the behalf of [name], MD,as directed by  [name], MD while in the presence of [name], MD.  Subjective:  Patient ID: [name] Gordon , male    DOB: January 30, 1976 , 48 y.o.   MRN: [id number]  Chief Complaint  Patient presents with   Chest Congestion    Sore Throat    HPI  Patient presents today for chest congestion & sore throat. Symptoms started Saturday. He also experiences a cough, runny nose & cold chills. He states his wife is also ill and he may have caught this from her. He does not think he has had a fever.  He has tried Mucinex with minimal relief of his sx.       Past Medical History:  Diagnosis Date   Fatty liver 2017   Mild   History of gout    History of kidney stones    Hypertension    Morbid obesity (HCC)    OA (osteoarthritis) of knee    Pre-diabetes    Spinal stenosis 2018     Family History  Problem Relation Age of Onset   Hypertension Mother    Diabetes Mother    Cancer Father      Current Outpatient Medications:    amoxicillin-clavulanate (AUGMENTIN) 875-125 MG tablet, Take 1 tablet by mouth 2 (two) times daily., Disp: 20 tablet, Rfl: 0   cholecalciferol (VITAMIN D3) 25 MCG (1000 UT) tablet, Take 1,000 Units by mouth daily., Disp: , Rfl:    ibuprofen (ADVIL) 200 MG tablet, Take 600 mg by mouth every 6 (six) hours as needed (Arthritis)., Disp: , Rfl:    Olopatadine HCl 0.2 % SOLN, One gtt in both eyes daily, Disp: 2.5 mL, Rfl: 0   oxyCODONE-acetaminophen (PERCOCET/ROXICET) 5-325 MG tablet, Take 1-2 tablets by mouth every 6 (six) hours as needed for severe pain., Disp: 30 tablet, Rfl: 0   amlodipine-olmesartan (AZOR) 10-20 MG tablet, Take 1 tablet by mouth daily., Disp: 90 tablet, Rfl: 1   colchicine 0.6 MG tablet, Take 2 tablets by mouth at the onset of gout pain and then 1 tablet in 1 hour. Do not take more than 3  tablets per gout flare up., Disp: 30 tablet, Rfl: 0   Nebivolol HCl (BYSTOLIC) 20 MG TABS, Take 1 tablet (20 mg total) by mouth daily at 6 (six) AM., Disp: 90 tablet, Rfl: 1   tirzepatide (MOUNJARO) 2.5 MG/0.5ML Pen, Inject 2.5 mg into the skin once a week., Disp: 2 mL, Rfl: 1   No Known Allergies   Review of Systems  Constitutional:  Positive for chills.  HENT:  Positive for congestion and sore throat.   Eyes: Negative.   Respiratory:  Positive for cough.   Gastrointestinal: Negative.   Skin: Negative.   Allergic/Immunologic: Negative.   Psychiatric/Behavioral: Negative.       Today's Vitals   01/19/24 1011  BP: 128/82  Pulse: 92  Temp: 98.6 F (37 C)  SpO2: 98%  Weight: (!) 401 lb 12.8 oz (182.3 kg)  Height: 5\' 10"  (1.778 m)   Body mass index is 57.65 kg/m.  Wt Readings from Last 3 Encounters:  01/19/24 (!) 401 lb 12.8 oz (182.3 kg)  11/13/23 (!) 402 lb (182.3 kg)  10/28/23 (!) 396 lb (179.6 kg)     Objective:  Physical Exam Vitals and nursing note reviewed.  Constitutional:      Appearance:  Normal appearance.  HENT:     Head: Normocephalic and atraumatic.     Comments: Frontal sinusitis tender to percussion Sounds congested    Mouth/Throat:     Mouth: No oral lesions.     Pharynx: Posterior oropharyngeal erythema present. No oropharyngeal exudate.  Cardiovascular:     Rate and Rhythm: Normal rate and regular rhythm.     Heart sounds: Normal heart sounds.  Pulmonary:     Effort: Pulmonary effort is normal.     Breath sounds: Normal breath sounds.  Skin:    General: Skin is warm.  Neurological:     General: No focal deficit present.     Mental Status: He is alert.  Psychiatric:        Mood and Affect: Mood normal.         Assessment And Plan:  Acute non-recurrent frontal sinusitis Assessment & Plan: COVID and Flu A/B negative.  I will send rx abx Augmentin, encouraged to take full course. He should also avoid dairy. He is advised to take Coricidin  prn and to drink at least one hot beverage daily.   Orders: -     POC SOFIA 2 FLU + SARS ANTIGEN FIA -     Amoxicillin-Pot Clavulanate; Take 1 tablet by mouth 2 (two) times daily.  Dispense: 20 tablet; Refill: 0  Essential hypertension, benign Assessment & Plan: Chronic, fair control.  Goal BP<120/80.  He will continue with amlodipine/olmesartan 10/20mg  daily and nebivolol 20mg  daily. Importance of medication and dietary compliance was discussed with the patient.   Orders: -     amLODIPine-Olmesartan; Take 1 tablet by mouth daily.  Dispense: 90 tablet; Refill: 1 -     Nebivolol HCl; Take 1 tablet (20 mg total) by mouth daily at 6 (six) AM.  Dispense: 90 tablet; Refill: 1  Other abnormal glucose Assessment & Plan: Previous labs reviewed, his A1c has been elevated in the past. I will check an A1c today. Reminded to avoid refined sugars including sugary drinks/foods and processed meats including bacon, sausages and deli meats.    Orders: -     Hemoglobin A1c  Class 3 severe obesity due to excess calories with serious comorbidity and body mass index (BMI) of 50.0 to 59.9 in adult [facility]) Assessment & Plan: BMI 57.  He has no known family history of thyroid cancer. Agrees to consider rx Zepbound, will send to start prior auth process. If approved, will schedule NV so he can learn how to self-administer the medication. He verbally acknowledges and agrees with treatment plan.   Orders: -     Mounjaro; Inject 2.5 mg into the skin once a week.  Dispense: 2 mL; Refill: 1  Family history of cancer Assessment & Plan: He has one sister with metastatic breast cancer, another sister with breast/colon cancer. Dad passed from prostate cancer. He would like to pursue genetic testing.   Orders: -     Ambulatory referral to Genetics  Polypharmacy -     Hepatic function panel  He is encouraged to strive for BMI less than 30 to decrease cardiac risk. Advised to aim for at least 150 minutes of exercise  per week.    Return if symptoms worsen or fail to improve, for cancel next Feb appt pls.  Patient was given opportunity to ask questions. Patient verbalized understanding of the plan and was able to repeat key elements of the plan. All questions were answered to their satisfaction.    I, [name],  MD, have reviewed all documentation for this visit. The documentation on 01/19/24 for the exam, diagnosis, procedures, and orders are all accurate and complete.   IF YOU HAVE BEEN REFERRED TO A SPECIALIST, IT MAY TAKE 1-2 WEEKS TO SCHEDULE/PROCESS THE REFERRAL. IF YOU HAVE NOT HEARD FROM US/SPECIALIST IN TWO WEEKS, PLEASE GIVE [place] A CALL AT [phone].   THE PATIENT IS ENCOURAGED TO PRACTICE SOCIAL DISTANCING DUE TO THE COVID-19 PANDEMIC.

## 2024-01-19 NOTE — Assessment & Plan Note (Signed)
 He has one sister with metastatic breast cancer, another sister with breast/colon cancer. Dad passed from prostate cancer. He would like to pursue genetic testing.

## 2024-01-19 NOTE — Assessment & Plan Note (Signed)
 COVID and Flu A/B negative.  I will send rx abx Augmentin , encouraged to take full course. He should also avoid dairy. He is advised to take Coricidin prn and to drink at least one hot beverage daily.

## 2024-01-19 NOTE — Assessment & Plan Note (Signed)
 Previous labs reviewed, his A1c has been elevated in the past. I will check an A1c today. Reminded to avoid refined sugars including sugary drinks/foods and processed meats including bacon, sausages and deli meats.

## 2024-01-19 NOTE — Assessment & Plan Note (Signed)
 Chronic, fair control.  Goal BP<120/80.  He will continue with amlodipine /olmesartan  10/20mg  daily and nebivolol  20mg  daily. Importance of medication and dietary compliance was discussed with the patient.

## 2024-01-19 NOTE — Assessment & Plan Note (Signed)
 BMI 57.  He has no known family history of thyroid cancer. Agrees to consider rx Zepbound , will send to start prior auth process. If approved, will schedule NV so he can learn how to self-administer the medication. He verbally acknowledges and agrees with treatment plan.

## 2024-01-19 NOTE — Patient Instructions (Signed)

## 2024-01-20 LAB — POC SOFIA 2 FLU + SARS ANTIGEN FIA
Influenza A, POC: NEGATIVE
Influenza B, POC: NEGATIVE
SARS Coronavirus 2 Ag: NEGATIVE

## 2024-01-20 LAB — HEPATIC FUNCTION PANEL
ALT: 45 [IU]/L — ABNORMAL HIGH (ref 0–44)
AST: 24 [IU]/L (ref 0–40)
Albumin: 4.3 g/dL (ref 4.1–5.1)
Alkaline Phosphatase: 104 [IU]/L (ref 44–121)
Bilirubin Total: 0.5 mg/dL (ref 0.0–1.2)
Bilirubin, Direct: 0.18 mg/dL (ref 0.00–0.40)
Total Protein: 6.9 g/dL (ref 6.0–8.5)

## 2024-01-20 LAB — HEMOGLOBIN A1C
Est. average glucose Bld gHb Est-mCnc: 137 mg/dL
Hgb A1c MFr Bld: 6.4 % — ABNORMAL HIGH (ref 4.8–5.6)

## 2024-01-21 ENCOUNTER — Encounter: Payer: Self-pay | Admitting: Internal Medicine

## 2024-01-22 ENCOUNTER — Other Ambulatory Visit: Payer: Self-pay | Admitting: Internal Medicine

## 2024-01-22 ENCOUNTER — Telehealth: Payer: Self-pay | Admitting: Genetic Counselor

## 2024-01-22 MED ORDER — NOREL AD 4-10-325 MG PO TABS: ORAL_TABLET | ORAL | 0 refills | Status: DC

## 2024-01-22 MED ORDER — HYDROCODONE BIT-HOMATROP MBR 5-1.5 MG/5ML PO SOLN: 5.0000 mL | Freq: Four times a day (QID) | ORAL | 0 refills | Status: DC | PRN

## 2024-01-22 NOTE — Telephone Encounter (Signed)
Left a voicemail to call back

## 2024-01-26 ENCOUNTER — Telehealth: Payer: Self-pay | Admitting: Genetic Counselor

## 2024-01-26 NOTE — Telephone Encounter (Signed)
 Called and left a voicemail informing the patient that since this was the third attempt to reach him, we would be closing the referral.

## 2024-01-27 ENCOUNTER — Ambulatory Visit: Payer: BC Managed Care – PPO | Admitting: Internal Medicine

## 2024-04-05 ENCOUNTER — Ambulatory Visit: Admitting: Internal Medicine

## 2024-04-05 ENCOUNTER — Encounter: Payer: Self-pay | Admitting: Internal Medicine

## 2024-04-05 VITALS — BP 134/84 | HR 81 | Temp 98.9°F | Ht 70.0 in | Wt 394.4 lb

## 2024-04-05 DIAGNOSIS — E66813 Obesity, class 3: Secondary | ICD-10-CM | POA: Diagnosis not present

## 2024-04-05 DIAGNOSIS — M170 Bilateral primary osteoarthritis of knee: Secondary | ICD-10-CM | POA: Diagnosis not present

## 2024-04-05 DIAGNOSIS — I10 Essential (primary) hypertension: Secondary | ICD-10-CM | POA: Diagnosis not present

## 2024-04-05 DIAGNOSIS — R0683 Snoring: Secondary | ICD-10-CM | POA: Diagnosis not present

## 2024-04-05 DIAGNOSIS — Z6841 Body Mass Index (BMI) 40.0 and over, adult: Secondary | ICD-10-CM

## 2024-04-05 MED ORDER — TRAMADOL HCL 50 MG PO TABS: 50.0000 mg | ORAL_TABLET | Freq: Two times a day (BID) | ORAL | 0 refills | Status: DC | PRN

## 2024-04-05 NOTE — Assessment & Plan Note (Signed)
 Suspected sleep apnea due to symptoms including snoring and waking up gasping for air. - Order home sleep study through [facility]. - If sleep apnea is confirmed, evaluate for Zepbound  coverage. - Consider referral to Dr. Raphael Cabal or Dr. Windsor Hatcher, sleep specialists, based on sleep study results.

## 2024-04-05 NOTE — Patient Instructions (Signed)
 Acute Knee Pain, Adult Many things can cause knee pain. Sometimes, knee pain is sudden (acute). It may be caused by damage, swelling, or irritation of the muscles and tissues that support your knee. Pain may come from: A fall. An injury to the knee from twisting motions. A hit to the knee. Infection. The pain often goes away on its own with time and rest. If the pain does not go away, tests may be done to find out what is causing the pain. These may include: Imaging tests, such as an X-ray, MRI, CT scan, or ultrasound. Joint aspiration. In this test, fluid is removed from the knee and checked. Arthroscopy. In this test, a lighted tube is put in the knee and an image is shown on a screen. A biopsy. In this test, a health care provider will remove a small piece of tissue for testing. Follow these instructions at home: If you have a knee sleeve or brace that can be taken off:  Wear the knee sleeve or brace as told by your provider. Take it off only if your provider says that you can. Check the skin around it every day. Tell your provider if you see problems. Loosen the knee sleeve or brace if your toes tingle, are numb, or turn cold and blue. Keep the knee sleeve or brace clean and dry. Bathing If the knee sleeve or brace is not waterproof: Do not let it get wet. Cover it when you take a bath or shower. Use a cover that does not let any water in. Managing pain, stiffness, and swelling  If told, put ice on the area. If you have a knee sleeve or brace that you can take off, remove it as told. Put ice in a plastic bag. Place a towel between your skin and the bag. Leave the ice on for 20 minutes, 2-3 times a day. If your skin turns bright red, take off the ice right away to prevent skin damage. The risk of damage is higher if you cannot feel pain, heat, or cold. Move your toes often to reduce stiffness and swelling. Raise the injured area above the level of your heart while you are sitting  or lying down. Use a pillow to support your foot as needed. If told, use an elastic bandage to put pressure (compression) on your injured knee. This may control swelling, give support, and help with discomfort. Sleep with a pillow under your knee. Activity Rest your knee. Do not do things that cause pain or make pain worse. Do not stand or walk on your injured knee until you're told it's okay. Use crutches as told. Avoid activities where both feet leave the ground at the same time and put stress on the joints. Avoid running, jumping rope, and doing jumping jacks. Work with a physical therapist to make a safe exercise program if told. Physical therapy helps your knee move better and get stronger. Exercise as told. General instructions Take your medicines only as told by your provider. If you are overweight, work with your provider and an expert in healthy eating, called a dietician, to set goals to lose weight. Being overweight can make your knee hurt more. Do not smoke, vape, or use products with nicotine or tobacco in them. If you need help quitting, talk with your provider. Return to normal activities when you are told. Ask what things are safe for you to do. Watch for any changes in your symptoms. Keep all follow-up visits. Your provider will check  your healing and adjust treatments if needed. Contact a health care provider if: The knee pain does not stop. The knee pain changes or gets worse. You have a fever along with knee pain. Your knee is red or feels warm when you touch it. Your knee gives out or locks up. Get help right away if: Your knee swells and the swelling gets worse. You cannot move your knee. You have very bad knee pain that does not get better with medicine. This information is not intended to replace advice given to you by your health care provider. Make sure you discuss any questions you have with your health care provider. Document Revised: 08/28/2023 Document  Reviewed: 01/20/2023 Elsevier Patient Education  2024 [employer].

## 2024-04-05 NOTE — Assessment & Plan Note (Addendum)
 Chronic knee pain managed with tramadol during the week and oxycodone /acetaminophen  on weekends. Tramadol is effective for workdays, but additional pain relief is needed on weekends. Discussed combining tramadol with acetaminophen  for enhanced pain relief. Encouraged aquatic therapy for knee pain management. Discussed potential weight loss surgery with Dr. Elvan Hamel as a long-term management strategy for knee pain. - Refill tramadol prescription. PDMP reviewed.  - Advise taking acetaminophen  with tramadol for enhanced pain relief. - Encourage aquatic therapy, such as walking in a pool, for knee pain management.

## 2024-04-05 NOTE — Assessment & Plan Note (Signed)
 Chronic, unfortunately his weight is negatively impacting the arthritis of his knees. He has been unsuccessful trying to lose weight on his own. He agrees to referral to Bariatric surgery.  - Set up appointment with Dr. Elvan Hamel for potential weight loss surgery consultation.

## 2024-04-05 NOTE — Progress Notes (Signed)
 I,[name] Gordon, CMA,acting as a Neurosurgeon for [name] Dung, MD.,have documented all relevant documentation on the behalf of [name] Dung, MD,as directed by  [name] Dung, MD while in the presence of [name] Dung, MD.  Subjective:  Patient ID: [name] Gordon , male    DOB: 12-25-1975 , 48 y.o.   MRN: [id number]  Chief Complaint  Patient presents with   Knee Pain    Patient presents today for knee pain in both knees. He states this has been an ongoing issue for a while. At home he uses advil, [facility] goody powder. Which does help sometimes.  He is established with orthopedic.     HPI Discussed the use of AI scribe software for clinical note transcription with the patient, who gave verbal consent to proceed.  History of Present Illness [name] Gordon is a 48 year old male with knee pain and hypertension who presents for follow-up of knee pain and blood pressure management.  He experiences persistent knee pain, managed with tramadol during the week and oxycodone  on weekends. Tramadol alleviates pain during workdays, but increased pain on weekends necessitates oxycodone . He recently ran out of both medications and has been using over-the-counter options like BC powders, which are not preferred. He has not tried combining tramadol with acetaminophen  for enhanced pain relief.  He has a history of hypertension and is currently taking amlodipine /olmesartan  10/20 mg daily and nebivolol  20 mg daily.   He experiences non-restorative sleep, nocturia, and early morning headaches. He has not completed a sleep study but reports symptoms such as snoring, waking up several times at night to urinate, falling asleep while watching TV or reading, and waking up with headaches. He never feels well-rested in the morning and sometimes wakes up gasping for air.  He has lost seven pounds recently, attributing this to returning to work and increased activity. He mentions his son's soccer games and plans for  his son to start swimming lessons, during which he considers walking in the pool to help with his knee pain.   Past Medical History:  Diagnosis Date   Fatty liver 2017   Mild   History of gout    History of kidney stones    Hypertension    Morbid obesity (HCC)    OA (osteoarthritis) of knee    Pre-diabetes    Spinal stenosis 2018     Family History  Problem Relation Age of Onset   Hypertension Mother    Diabetes Mother    Cancer Father      Current Outpatient Medications:    amlodipine -olmesartan  (AZOR ) 10-20 MG tablet, Take 1 tablet by mouth daily., Disp: 90 tablet, Rfl: 1   Chlorphen-PE-Acetaminophen  (NOREL AD) 4-10-325 MG TABS, One tab po twice daily as needed, Disp: 20 tablet, Rfl: 0   cholecalciferol (VITAMIN D3) 25 MCG (1000 UT) tablet, Take 1,000 Units by mouth daily., Disp: , Rfl:    ibuprofen (ADVIL) 200 MG tablet, Take 600 mg by mouth every 6 (six) hours as needed (Arthritis)., Disp: , Rfl:    Nebivolol  HCl (BYSTOLIC ) 20 MG TABS, Take 1 tablet (20 mg total) by mouth daily at 6 (six) AM., Disp: 90 tablet, Rfl: 1   Olopatadine  HCl 0.2 % SOLN, One gtt in both eyes daily, Disp: 2.5 mL, Rfl: 0   oxyCODONE -acetaminophen  (PERCOCET/ROXICET) 5-325 MG tablet, Take 1-2 tablets by mouth every 6 (six) hours as needed for severe pain., Disp: 30 tablet, Rfl: 0   tirzepatide  (MOUNJARO) 2.5 MG/0.5ML Pen, Inject  2.5 mg into the skin once a week., Disp: 2 mL, Rfl: 1   traMADol (ULTRAM) 50 MG tablet, Take 1 tablet (50 mg total) by mouth every 12 (twelve) hours as needed for moderate pain (pain score 4-6)., Disp: 30 tablet, Rfl: 0   No Known Allergies   Review of Systems  Constitutional: Negative.   HENT: Negative.    Respiratory: Negative.    Cardiovascular: Negative.   Skin: Negative.   Allergic/Immunologic: Negative.   Neurological: Negative.   Hematological: Negative.      Today's Vitals   04/05/24 1423  BP: 134/84  Pulse: 81  Temp: 98.9 F (37.2 C)  SpO2: 98%  Weight:  (!) 394 lb 6.4 oz (178.9 kg)  Height: 5\' 10"  (1.778 m)   Body mass index is 56.59 kg/m.  Wt Readings from Last 3 Encounters:  04/05/24 (!) 394 lb 6.4 oz (178.9 kg)  01/19/24 (!) 401 lb 12.8 oz (182.3 kg)  11/13/23 (!) 402 lb (182.3 kg)     Objective:  Physical Exam Vitals and nursing note reviewed.  Constitutional:      Appearance: Normal appearance. He is obese.  HENT:     Head: Normocephalic and atraumatic.  Eyes:     Extraocular Movements: Extraocular movements intact.  Cardiovascular:     Rate and Rhythm: Normal rate and regular rhythm.     Heart sounds: Normal heart sounds.  Pulmonary:     Effort: Pulmonary effort is normal.     Breath sounds: Normal breath sounds.  Musculoskeletal:     Cervical back: Normal range of motion.  Skin:    General: Skin is warm.  Neurological:     General: No focal deficit present.     Mental Status: He is alert.  Psychiatric:        Mood and Affect: Mood normal.         Assessment And Plan:  Primary osteoarthritis of both knees Assessment & Plan: Chronic knee pain managed with tramadol during the week and oxycodone /acetaminophen  on weekends. Tramadol is effective for workdays, but additional pain relief is needed on weekends. Discussed combining tramadol with acetaminophen  for enhanced pain relief. Encouraged aquatic therapy for knee pain management. Discussed potential weight loss surgery with Dr. Elvan Gordon as a long-term management strategy for knee pain. - Refill tramadol prescription. PDMP reviewed.  - Advise taking acetaminophen  with tramadol for enhanced pain relief. - Encourage aquatic therapy, such as walking in a pool, for knee pain management.    Essential hypertension, benign Assessment & Plan: Chronic, fair control.  Goal BP is less than 130/80.  His hypertension is managed with amlodipine /olmesartan  10/20 mg daily and nebivolol  20 mg daily. - Continue amlodipine /olmesartan  10/20 mg daily. - Continue nebivolol  20 mg  daily. - He will rto in June 2025 for his next physical exam.    Loud snoring Assessment & Plan: Suspected sleep apnea due to symptoms including snoring and waking up gasping for air. - Order home sleep study through [facility]. - If sleep apnea is confirmed, evaluate for Zepbound  coverage. - Consider referral to Dr. Raphael Cabal or Dr. Windsor Hatcher, sleep specialists, based on sleep study results.   Class 3 severe obesity due to excess calories with serious comorbidity and body mass index (BMI) of 50.0 to 59.9 in adult [facility]) Assessment & Plan: Chronic, unfortunately his weight is negatively impacting the arthritis of his knees. He has been unsuccessful trying to lose weight on his own. He agrees to referral to Bariatric surgery.  - Set up  appointment with Dr. Elvan Gordon for potential weight loss surgery consultation.  Orders: -     Ambulatory referral to General Surgery  Other orders -     traMADol HCl; Take 1 tablet (50 mg total) by mouth every 12 (twelve) hours as needed for moderate pain (pain score 4-6).  Dispense: 30 tablet; Refill: 0   Return if symptoms worsen or fail to improve.  Patient was given opportunity to ask questions. Patient verbalized understanding of the plan and was able to repeat key elements of the plan. All questions were answered to their satisfaction.    I, [name] Dung, MD, have reviewed all documentation for this visit. The documentation on 04/05/24 for the exam, diagnosis, procedures, and orders are all accurate and complete.   IF YOU HAVE BEEN REFERRED TO A SPECIALIST, IT MAY TAKE 1-2 WEEKS TO SCHEDULE/PROCESS THE REFERRAL. IF YOU HAVE NOT HEARD FROM US /SPECIALIST IN TWO WEEKS, PLEASE GIVE US  A CALL AT [phone].   THE PATIENT IS ENCOURAGED TO PRACTICE SOCIAL DISTANCING DUE TO THE COVID-19 PANDEMIC.

## 2024-04-05 NOTE — Assessment & Plan Note (Signed)
 Chronic, fair control.  Goal BP is less than 130/80.  His hypertension is managed with amlodipine /olmesartan  10/20 mg daily and nebivolol  20 mg daily. - Continue amlodipine /olmesartan  10/20 mg daily. - Continue nebivolol  20 mg daily. - He will rto in June 2025 for his next physical exam.

## 2024-04-10 ENCOUNTER — Encounter: Payer: Self-pay | Admitting: Internal Medicine

## 2024-04-12 ENCOUNTER — Other Ambulatory Visit: Payer: Self-pay | Admitting: Internal Medicine

## 2024-04-12 MED ORDER — OXYCODONE-ACETAMINOPHEN 5-325 MG PO TABS: 1.0000 | ORAL_TABLET | Freq: Four times a day (QID) | ORAL | 0 refills | Status: DC | PRN

## 2024-04-16 ENCOUNTER — Other Ambulatory Visit: Payer: Self-pay | Admitting: Internal Medicine

## 2024-04-16 DIAGNOSIS — I10 Essential (primary) hypertension: Secondary | ICD-10-CM

## 2024-05-10 ENCOUNTER — Ambulatory Visit (INDEPENDENT_AMBULATORY_CARE_PROVIDER_SITE_OTHER): Payer: Self-pay | Admitting: Internal Medicine

## 2024-05-10 ENCOUNTER — Encounter: Payer: Self-pay | Admitting: Internal Medicine

## 2024-05-10 ENCOUNTER — Ambulatory Visit: Payer: Self-pay | Admitting: Internal Medicine

## 2024-05-10 VITALS — BP 132/74 | HR 72 | Temp 98.3°F | Ht 70.0 in | Wt 391.2 lb

## 2024-05-10 DIAGNOSIS — G4726 Circadian rhythm sleep disorder, shift work type: Secondary | ICD-10-CM | POA: Diagnosis not present

## 2024-05-10 DIAGNOSIS — R0683 Snoring: Secondary | ICD-10-CM | POA: Diagnosis not present

## 2024-05-10 DIAGNOSIS — M170 Bilateral primary osteoarthritis of knee: Secondary | ICD-10-CM | POA: Diagnosis not present

## 2024-05-10 DIAGNOSIS — Z6841 Body Mass Index (BMI) 40.0 and over, adult: Secondary | ICD-10-CM

## 2024-05-10 DIAGNOSIS — Z0000 Encounter for general adult medical examination without abnormal findings: Secondary | ICD-10-CM | POA: Insufficient documentation

## 2024-05-10 DIAGNOSIS — E66813 Obesity, class 3: Secondary | ICD-10-CM

## 2024-05-10 DIAGNOSIS — Z823 Family history of stroke: Secondary | ICD-10-CM

## 2024-05-10 DIAGNOSIS — R7303 Prediabetes: Secondary | ICD-10-CM

## 2024-05-10 DIAGNOSIS — I10 Essential (primary) hypertension: Secondary | ICD-10-CM | POA: Diagnosis not present

## 2024-05-10 LAB — POCT URINALYSIS DIPSTICK
Bilirubin, UA: NEGATIVE
Blood, UA: NEGATIVE
Glucose, UA: NEGATIVE
Ketones, UA: NEGATIVE
Leukocytes, UA: NEGATIVE
Nitrite, UA: NEGATIVE
Protein, UA: NEGATIVE
Spec Grav, UA: >=1.03 — AB (ref 1.010–1.025)
Urobilinogen, UA: 0.2 U/dL
pH, UA: 6 (ref 5.0–8.0)

## 2024-05-10 MED ORDER — TRAMADOL HCL 50 MG PO TABS: 50.0000 mg | ORAL_TABLET | Freq: Two times a day (BID) | ORAL | 0 refills | Status: DC | PRN

## 2024-05-10 MED ORDER — NEBIVOLOL HCL 20 MG PO TABS
20.0000 mg | ORAL_TABLET | Freq: Every day | ORAL | 1 refills | Status: DC
Start: 2024-05-10 — End: 2024-08-03

## 2024-05-10 MED ORDER — AMLODIPINE-OLMESARTAN 10-20 MG PO TABS
1.0000 | ORAL_TABLET | Freq: Every day | ORAL | 1 refills | Status: DC
Start: 2024-05-10 — End: 2024-08-03

## 2024-05-10 NOTE — Assessment & Plan Note (Signed)
A full exam was performed.  DRE deferred, he is followed by Urology.  He is advised to get 30-45 minutes of regular exercise, no less than four to five days per week. Both weight-bearing and aerobic exercises are recommended.  He is advised to follow a healthy diet with at least six fruits/veggies per day, decrease intake of red meat and other saturated fats and to increase fish intake to twice weekly.  Meats/fish should not be fried -- baked, boiled or broiled is preferable. It is also important to cut back on your sugar intake.  Be sure to read labels - try to avoid anything with added sugar, high fructose corn syrup or other sweeteners.  If you must use a sweetener, you can try stevia or monkfruit.  It is also important to avoid artificially sweetened foods/beverages and diet drinks. Lastly, wear SPF 50 sunscreen on exposed skin and when in direct sunlight for an extended period of time.  Be sure to avoid fast food restaurants and aim for at least 60 ounces of water daily.

## 2024-05-10 NOTE — Patient Instructions (Addendum)
 Zepbound  for sleep apnea  Health Maintenance, Male Adopting a healthy lifestyle and getting preventive care are important in promoting health and wellness. Ask your health care provider about: The right schedule for you to have regular tests and exams. Things you can do on your own to prevent diseases and keep yourself healthy. What should I know about diet, weight, and exercise? Eat a healthy diet  Eat a diet that includes plenty of vegetables, fruits, low-fat dairy products, and lean protein. Do not eat a lot of foods that are high in solid fats, added sugars, or sodium. Maintain a healthy weight Body mass index (BMI) is a measurement that can be used to identify possible weight problems. It estimates body fat based on height and weight. Your health care provider can help determine your BMI and help you achieve or maintain a healthy weight. Get regular exercise Get regular exercise. This is one of the most important things you can do for your health. Most adults should: Exercise for at least 150 minutes each week. The exercise should increase your heart rate and make you sweat (moderate-intensity exercise). Do strengthening exercises at least twice a week. This is in addition to the moderate-intensity exercise. Spend less time sitting. Even light physical activity can be beneficial. Watch cholesterol and blood lipids Have your blood tested for lipids and cholesterol at 50 years of age, then have this test every 5 years. You may need to have your cholesterol levels checked more often if: Your lipid or cholesterol levels are high. You are older than 50 years of age. You are at high risk for heart disease. What should I know about cancer screening? Many types of cancers can be detected early and may often be prevented. Depending on your health history and family history, you may need to have cancer screening at various ages. This may include screening for: Colorectal cancer. Prostate  cancer. Skin cancer. Lung cancer. What should I know about heart disease, diabetes, and high blood pressure? Blood pressure and heart disease High blood pressure causes heart disease and increases the risk of stroke. This is more likely to develop in people who have high blood pressure readings or are overweight. Talk with your health care provider about your target blood pressure readings. Have your blood pressure checked: Every 3-5 years if you are 50-76 years of age. Every year if you are 50 years old or older. If you are between the ages of 66 and 90 and are a current or former smoker, ask your health care provider if you should have a one-time screening for abdominal aortic aneurysm (AAA). Diabetes Have regular diabetes screenings. This checks your fasting blood sugar level. Have the screening done: Once every three years after age 20 if you are at a normal weight and have a low risk for diabetes. More often and at a younger age if you are overweight or have a high risk for diabetes. What should I know about preventing infection? Hepatitis B If you have a higher risk for hepatitis B, you should be screened for this virus. Talk with your health care provider to find out if you are at risk for hepatitis B infection. Hepatitis C Blood testing is recommended for: Everyone born from [birth year] through 1965. Anyone with known risk factors for hepatitis C. Sexually transmitted infections (STIs) You should be screened each year for STIs, including gonorrhea and chlamydia, if: You are sexually active and are younger than 50 years of age. You are older than 50  years of age and your health care provider tells you that you are at risk for this type of infection. Your sexual activity has changed since you were last screened, and you are at increased risk for chlamydia or gonorrhea. Ask your health care provider if you are at risk. Ask your health care provider about whether you are at high risk for HIV.  Your health care provider may recommend a prescription medicine to help prevent HIV infection. If you choose to take medicine to prevent HIV, you should first get tested for HIV. You should then be tested every 3 months for as long as you are taking the medicine. Follow these instructions at home: Alcohol use Do not drink alcohol if your health care provider tells you not to drink. If you drink alcohol: Limit how much you have to 0-2 drinks a day. Know how much alcohol is in your drink. In the [place] one drink equals one 12 oz bottle of beer (355 mL), one 5 oz glass of wine (148 mL), or one 1 oz glass of hard liquor (44 mL). Lifestyle Do not use any products that contain nicotine or tobacco. These products include cigarettes, chewing tobacco, and vaping devices, such as e-cigarettes. If you need help quitting, ask your health care provider. Do not use street drugs. Do not share needles. Ask your health care provider for help if you need support or information about quitting drugs. General instructions Schedule regular health, dental, and eye exams. Stay current with your vaccines. Tell your health care provider if: You often feel depressed. You have ever been abused or do not feel safe at home. Summary Adopting a healthy lifestyle and getting preventive care are important in promoting health and wellness. Follow your health care provider's instructions about healthy diet, exercising, and getting tested or screened for diseases. Follow your health care provider's instructions on monitoring your cholesterol and blood pressure. This information is not intended to replace advice given to you by your health care provider. Make sure you discuss any questions you have with your health care provider. Document Revised: 04/16/2021 Document Reviewed: 04/16/2021 Elsevier Patient Education  2024 [employer].

## 2024-05-10 NOTE — Assessment & Plan Note (Signed)
 BMI 56. Unfortunately, he did not hear from [facility] for weight loss surgical evaluation. Will speak with referral coordinator.

## 2024-05-10 NOTE — Progress Notes (Unsigned)
 I,Victoria T Basil Lim, CMA,acting as a Neurosurgeon for Smiley Dung, MD.,have documented all relevant documentation on the behalf of Smiley Dung, MD,as directed by  Smiley Dung, MD while in the presence of Smiley Dung, MD.  Subjective:   Patient ID: [name] Gordon , male    DOB: 11-Apr-1976 , 48 y.o.   MRN: [id number]  Chief Complaint  Patient presents with   Annual Exam    Patient presents today for annual exam. He reports compliance with medications. Denies headache, chest pain & sob. He has no specific questions or concerns. He completes prostate exams with urology.    Hypertension   Hyperlipidemia    HPI Discussed the use of AI scribe software for clinical note transcription with the patient, who gave verbal consent to proceed.  History of Present Illness [name] Gordon is a 48 year old male who presents for a routine physical exam and blood pressure check.  He has difficulty sleeping on weekends due to a disrupted sleep schedule and knee pain. He has recently started using Tylenol  PM to aid sleep. He takes tramadol  for pain management and uses oxycodone  for severe pain.   He has a history of knee pain that impacts his sleep and physical activity. He has been more active since returning to work but experiences challenges due to pain, particularly in his shoulder. Initially, his shoulder was fine upon returning to work, but recently he has started to feel discomfort again. He has not been performing the exercises taught in physical therapy, believing that keeping the shoulder still might help.  He has a history of elevated liver enzymes noted in February. He has not eaten prior to the visit.  No family history of heart disease, but his mother had a stroke two years ago, resulting in nerve damage on her face.  He has not received a call regarding a CPAP machine or weight loss surgery referral, despite previous referrals being made.  He reports regular bowel movements and  acknowledges limited water intake at home despite increased activity at work.   Hypertension This is a chronic problem. The current episode started more than 1 year ago. The problem is unchanged. The problem is controlled. Pertinent negatives include no anxiety. There are no associated agents to hypertension. Risk factors for coronary artery disease include obesity, sedentary lifestyle and male gender. Past treatments include angiotensin blockers, calcium channel blockers and lifestyle changes. There are no compliance problems.      Past Medical History:  Diagnosis Date   Fatty liver 2017   Mild   History of gout    History of kidney stones    Hypertension    Morbid obesity (HCC)    OA (osteoarthritis) of knee    Pre-diabetes    Spinal stenosis 2018     Family History  Problem Relation Age of Onset   Hypertension Mother    Diabetes Mother    Cancer Father      Current Outpatient Medications:    amlodipine -olmesartan  (AZOR ) 10-20 MG tablet, Take 1 tablet by mouth daily., Disp: 90 tablet, Rfl: 1   cholecalciferol (VITAMIN D3) 25 MCG (1000 UT) tablet, Take 1,000 Units by mouth daily., Disp: , Rfl:    ibuprofen (ADVIL) 200 MG tablet, Take 600 mg by mouth every 6 (six) hours as needed (Arthritis)., Disp: , Rfl:    Nebivolol  HCl (BYSTOLIC ) 20 MG TABS, Take 1 tablet (20 mg total) by mouth daily at 6 (six) AM., Disp: 90 tablet, Rfl: 1  Olopatadine  HCl 0.2 % SOLN, One gtt in both eyes daily, Disp: 2.5 mL, Rfl: 0   oxyCODONE -acetaminophen  (PERCOCET/ROXICET) 5-325 MG tablet, Take 1-2 tablets by mouth every 6 (six) hours as needed for severe pain (pain score 7-10)., Disp: 30 tablet, Rfl: 0   tirzepatide  (MOUNJARO) 2.5 MG/0.5ML Pen, Inject 2.5 mg into the skin once a week., Disp: 2 mL, Rfl: 1   traMADol  (ULTRAM ) 50 MG tablet, Take 1 tablet (50 mg total) by mouth every 12 (twelve) hours as needed for moderate pain (pain score 4-6)., Disp: 30 tablet, Rfl: 0   No Known Allergies   Men's  preventive visit. Patient Health Questionnaire (PHQ-2) is  Flowsheet Row Office Visit from 05/10/2024 in [facility]  PHQ-2 Total Score 0     . Patient is on a low salt diet. Marital status: Married. Relevant history for alcohol use is:  Social History   Substance and Sexual Activity  Alcohol Use Yes   Comment: 2 days per week  . Relevant history for tobacco use is:  Social History   Tobacco Use  Smoking Status Never  Smokeless Tobacco Never  .   Review of Systems  Constitutional: Negative.   HENT: Negative.    Eyes: Negative.   Respiratory: Negative.    Cardiovascular: Negative.   Gastrointestinal: Negative.   Endocrine: Negative.   Genitourinary: Negative.   Musculoskeletal:  Positive for arthralgias.  Skin: Negative.   Allergic/Immunologic: Negative.   Neurological: Negative.   Hematological: Negative.   Psychiatric/Behavioral: Negative.    c   Today's Vitals   05/10/24 0842 05/10/24 0849  BP: (!) 140/82 132/74  Pulse: 72   Temp: 98.3 F (36.8 C)   SpO2: 98%   Weight: (!) 391 lb 3.2 oz (177.4 kg)   Height: 5\' 10"  (1.778 m)    Body mass index is 56.13 kg/m.  Wt Readings from Last 3 Encounters:  05/10/24 (!) 391 lb 3.2 oz (177.4 kg)  04/05/24 (!) 394 lb 6.4 oz (178.9 kg)  01/19/24 (!) 401 lb 12.8 oz (182.3 kg)    Objective:  Physical Exam Vitals and nursing note reviewed.  Constitutional:      Appearance: Normal appearance. He is obese.  HENT:     Head: Normocephalic and atraumatic.     Right Ear: Tympanic membrane, ear canal and external ear normal.     Left Ear: Tympanic membrane, ear canal and external ear normal.     Nose: Nose normal.     Mouth/Throat:     Mouth: Mucous membranes are moist.     Pharynx: Oropharynx is clear.  Eyes:     Extraocular Movements: Extraocular movements intact.     Conjunctiva/sclera: Conjunctivae normal.     Pupils: Pupils are equal, round, and reactive to light.  Cardiovascular:      Rate and Rhythm: Normal rate and regular rhythm.     Pulses: Normal pulses.     Heart sounds: Normal heart sounds.  Pulmonary:     Effort: Pulmonary effort is normal.     Breath sounds: Normal breath sounds.  Chest:  Breasts:    Right: Normal. No swelling, bleeding, inverted nipple, mass or nipple discharge.     Left: Normal. No swelling, bleeding, inverted nipple, mass or nipple discharge.  Abdominal:     General: Bowel sounds are normal.     Palpations: Abdomen is soft.     Comments: Obese, soft. Difficult to assess organomegaly due to body habitus  Genitourinary:  Comments: Deferred  Musculoskeletal:        General: Normal range of motion.     Cervical back: Normal range of motion and neck supple.     Right lower leg: Edema present.     Left lower leg: Edema present.  Skin:    General: Skin is warm.  Neurological:     General: No focal deficit present.     Mental Status: He is alert.  Psychiatric:        Mood and Affect: Mood normal.        Behavior: Behavior normal.      Assessment And Plan:    Encounter for general adult medical examination w/o abnormal findings Assessment & Plan: A full exam was performed.  DRE deferred, he is followed by Urology.  He is advised to get 30-45 minutes of regular exercise, no less than four to five days per week. Both weight-bearing and aerobic exercises are recommended.  He is advised to follow a healthy diet with at least six fruits/veggies per day, decrease intake of red meat and other saturated fats and to increase fish intake to twice weekly.  Meats/fish should not be fried -- baked, boiled or broiled is preferable. It is also important to cut back on your sugar intake.  Be sure to read labels - try to avoid anything with added sugar, high fructose corn syrup or other sweeteners.  If you must use a sweetener, you can try stevia or monkfruit.  It is also important to avoid artificially sweetened foods/beverages and diet drinks.  Lastly, wear SPF 50 sunscreen on exposed skin and when in direct sunlight for an extended period of time.  Be sure to avoid fast food restaurants and aim for at least 60 ounces of water daily.      Orders: -     CMP14+EGFR -     Lipid panel -     CBC -     PSA Total (Reflex To Free) -     %fPSA Reflex  Essential hypertension, benign Assessment & Plan: Chronic.  EKG performed, NSR w/o acute changes. Goal BP<120/80.  Hypertension managed with Bystolic  and amlodipine . Blood pressure checked today. - Refill Bystolic  20 mg daily. - Refill amlodipine  10 mg daily. - Importance of following low sodium diet was discussed with the patient.  - F/u in 4 months  Orders: -     POCT urinalysis dipstick -     Microalbumin / creatinine urine ratio -     EKG 12-Lead -     amLODIPine -Olmesartan ; Take 1 tablet by mouth daily.  Dispense: 90 tablet; Refill: 1 -     Nebivolol  HCl; Take 1 tablet (20 mg total) by mouth daily at 6 (six) AM.  Dispense: 90 tablet; Refill: 1  Primary osteoarthritis of both knees Assessment & Plan: Chronic knee pain managed with tramadol  during the week and oxycodone /acetaminophen  on weekends. Tramadol  is effective for workdays, but additional pain relief is needed on weekends. Discussed combining tramadol  with acetaminophen  for enhanced pain relief. Encouraged aquatic therapy for knee pain management. Discussed potential weight loss surgery with Dr. Elvan Hamel as a long-term management strategy for knee pain. - Refill tramadol  prescription. PDMP reviewed.  - Advise taking acetaminophen  with tramadol  for enhanced pain relief. - Encourage aquatic therapy, such as walking in a pool, for knee pain management.   Orders: -     traMADol  HCl; Take 1 tablet (50 mg total) by mouth every 12 (twelve) hours as needed for moderate  pain (pain score 4-6).  Dispense: 30 tablet; Refill: 0  Circadian rhythm sleep disorder, shift work type Assessment & Plan: Chronic, may benefit from sleep meds on  the weekends.  - Risks of continued use of Benadryl on cognition was discussed with the patient - Use Tylenol  PM sparingly   Snoring Assessment & Plan: Pending CPAP referral and weight loss surgery consultation. Insurance coverage for Zepbound  needs confirmation. - Resend referral for sleep study to [facility] - Instruct him to call insurance to check coverage for Zepbound  for sleep apnea. - Follow up on sleep study referral once insurance coverage is confirmed.   Prediabetes Assessment & Plan: Previous labs reviewed, his A1c has been elevated in the past. I will check an A1c today. Reminded to avoid refined sugars including sugary drinks/foods and processed meats including bacon, sausages and deli meats.    Orders: -     Hemoglobin A1c  Class 3 severe obesity due to excess calories with serious comorbidity and body mass index (BMI) of 50.0 to 59.9 in adult Assessment & Plan: BMI 56. Unfortunately, he did not hear from [facility] for weight loss surgical evaluation. Will speak with referral coordinator to get this scheduled.    Family history of cerebrovascular accident -     Lipoprotein A (LPA)   c Return for 1 year HM, 4 month pre-dm f/u. Aaron Aas Patient was given opportunity to ask questions. Patient verbalized understanding of the plan and was able to repeat key elements of the plan. All questions were answered to their satisfaction.   I, Smiley Dung, MD, have reviewed all documentation for this visit. The documentation on 05/10/24 for the exam, diagnosis, procedures, and orders are all accurate and complete.

## 2024-05-10 NOTE — Assessment & Plan Note (Signed)
 Previous labs reviewed, his A1c has been elevated in the past. I will check an A1c today. Reminded to avoid refined sugars including sugary drinks/foods and processed meats including bacon, sausages and deli meats.

## 2024-05-11 LAB — HEMOGLOBIN A1C
Est. average glucose Bld gHb Est-mCnc: 120 mg/dL
Hgb A1c MFr Bld: 5.8 % — ABNORMAL HIGH (ref 4.8–5.6)

## 2024-05-11 LAB — CMP14+EGFR
ALT: 41 IU/L (ref 0–44)
AST: 25 IU/L (ref 0–40)
Albumin: 4.2 g/dL (ref 4.1–5.1)
Alkaline Phosphatase: 97 IU/L (ref 44–121)
BUN/Creatinine Ratio: 13 (ref 9–20)
BUN: 13 mg/dL (ref 6–24)
Bilirubin Total: 0.3 mg/dL (ref 0.0–1.2)
CO2: 21 mmol/L (ref 20–29)
Calcium: 9.7 mg/dL (ref 8.7–10.2)
Chloride: 105 mmol/L (ref 96–106)
Creatinine, Ser: 1.03 mg/dL (ref 0.76–1.27)
Globulin, Total: 2.7 g/dL (ref 1.5–4.5)
Glucose: 102 mg/dL — ABNORMAL HIGH (ref 70–99)
Potassium: 4.9 mmol/L (ref 3.5–5.2)
Sodium: 144 mmol/L (ref 134–144)
Total Protein: 6.9 g/dL (ref 6.0–8.5)
eGFR: 89 mL/min/{1.73_m2} (ref >59)

## 2024-05-11 LAB — MICROALBUMIN / CREATININE URINE RATIO
Creatinine, Urine: 159.5 mg/dL
Microalb/Creat Ratio: 5 mg/g{creat} (ref 0–29)
Microalbumin, Urine: 8.6 ug/mL

## 2024-05-11 LAB — CBC
Hematocrit: 44.8 % (ref 37.5–51.0)
Hemoglobin: 14.2 g/dL (ref 13.0–17.7)
MCH: 27.7 pg (ref 26.6–33.0)
MCHC: 31.7 g/dL (ref 31.5–35.7)
MCV: 87 fL (ref 79–97)
Platelets: 306 10*3/uL (ref 150–450)
RBC: 5.13 x10E6/uL (ref 4.14–5.80)
RDW: 14 % (ref 11.6–15.4)
WBC: 9.9 10*3/uL (ref 3.4–10.8)

## 2024-05-11 LAB — LIPID PANEL
Chol/HDL Ratio: 3.2 ratio (ref 0.0–5.0)
Cholesterol, Total: 171 mg/dL (ref 100–199)
HDL: 53 mg/dL (ref >39)
LDL Chol Calc (NIH): 102 mg/dL — ABNORMAL HIGH (ref 0–99)
Triglycerides: 85 mg/dL (ref 0–149)
VLDL Cholesterol Cal: 16 mg/dL (ref 5–40)

## 2024-05-11 LAB — PSA TOTAL (REFLEX TO FREE): Prostate Specific Ag, Serum: 5.5 ng/mL — ABNORMAL HIGH (ref 0.0–4.0)

## 2024-05-11 LAB — FPSA% REFLEX
% FREE PSA: 16.7 %
PSA, FREE: 0.92 ng/mL

## 2024-05-11 LAB — LIPOPROTEIN A (LPA): Lipoprotein (a): <8.4 nmol/L (ref <75.0)

## 2024-05-13 NOTE — Assessment & Plan Note (Addendum)
 Chronic.  EKG performed, NSR w/o acute changes. Goal BP<120/80.  Hypertension managed with Bystolic  and amlodipine . Blood pressure checked today. - Refill Bystolic  20 mg daily. - Refill amlodipine  10 mg daily. - Importance of following low sodium diet was discussed with the patient.  - F/u in 4 months

## 2024-05-13 NOTE — Assessment & Plan Note (Signed)
 Chronic knee pain managed with tramadol during the week and oxycodone /acetaminophen  on weekends. Tramadol is effective for workdays, but additional pain relief is needed on weekends. Discussed combining tramadol with acetaminophen  for enhanced pain relief. Encouraged aquatic therapy for knee pain management. Discussed potential weight loss surgery with Dr. Elvan Hamel as a long-term management strategy for knee pain. - Refill tramadol prescription. PDMP reviewed.  - Advise taking acetaminophen  with tramadol for enhanced pain relief. - Encourage aquatic therapy, such as walking in a pool, for knee pain management.

## 2024-05-13 NOTE — Assessment & Plan Note (Signed)
 Chronic, may benefit from sleep meds on the weekends.  - Risks of continued use of Benadryl on cognition was discussed with the patient - Use Tylenol  PM sparingly

## 2024-05-13 NOTE — Assessment & Plan Note (Signed)
 Pending CPAP referral and weight loss surgery consultation. Insurance coverage for Zepbound  needs confirmation. - Resend referral for sleep study to [facility] - Instruct him to call insurance to check coverage for Zepbound  for sleep apnea. - Follow up on sleep study referral once insurance coverage is confirmed.

## 2024-08-02 ENCOUNTER — Encounter: Payer: Self-pay | Admitting: Internal Medicine

## 2024-08-03 ENCOUNTER — Encounter: Payer: Self-pay | Admitting: Internal Medicine

## 2024-08-03 ENCOUNTER — Ambulatory Visit: Admitting: Internal Medicine

## 2024-08-03 ENCOUNTER — Other Ambulatory Visit: Payer: Self-pay

## 2024-08-03 VITALS — BP 132/84 | HR 92 | Temp 98.1°F | Ht 70.0 in | Wt 394.0 lb

## 2024-08-03 DIAGNOSIS — M170 Bilateral primary osteoarthritis of knee: Secondary | ICD-10-CM

## 2024-08-03 DIAGNOSIS — I10 Essential (primary) hypertension: Secondary | ICD-10-CM

## 2024-08-03 DIAGNOSIS — E66813 Obesity, class 3: Secondary | ICD-10-CM | POA: Diagnosis not present

## 2024-08-03 DIAGNOSIS — Z6841 Body Mass Index (BMI) 40.0 and over, adult: Secondary | ICD-10-CM | POA: Diagnosis not present

## 2024-08-03 MED ORDER — NEBIVOLOL HCL 20 MG PO TABS: 20.0000 mg | ORAL_TABLET | Freq: Every day | ORAL | 1 refills | Status: AC

## 2024-08-03 MED ORDER — TRAMADOL HCL 50 MG PO TABS: 50.0000 mg | ORAL_TABLET | Freq: Two times a day (BID) | ORAL | 0 refills | Status: DC | PRN

## 2024-08-03 MED ORDER — KETOROLAC TROMETHAMINE 60 MG/2ML IM SOLN
60.0000 mg | Freq: Once | INTRAMUSCULAR | Status: AC
  Administered 2024-08-03: 60 mg via INTRAMUSCULAR

## 2024-08-03 MED ORDER — AMLODIPINE-OLMESARTAN 10-20 MG PO TABS: 1.0000 | ORAL_TABLET | Freq: Every day | ORAL | 1 refills | Status: AC

## 2024-08-03 NOTE — Progress Notes (Signed)
 I,[name], CMA,acting as a Neurosurgeon for Catheryn LOISE Slocumb, MD.,have documented all relevant documentation on the behalf of Catheryn LOISE Slocumb, MD,as directed by  Catheryn LOISE Slocumb, MD while in the presence of Catheryn LOISE Slocumb, MD.  Subjective:  Patient ID: [name] Gordon , male    DOB: 08/06/1976 , 48 y.o.   MRN: [id number]  Chief Complaint  Patient presents with   Weight Check    Patient presents today for weight check. He would like to inquire about being put on a medication for weight loss. Previously he has been put on Wegovy  & Ozempic . He has tried diet & exercise. He also would like a refill for Tramadol . He also wants to know if he could get a pain shot today.     HPI Discussed the use of AI scribe software for clinical note transcription with the patient, who gave verbal consent to proceed.  History of Present Illness [name] Gordon is a 48 year old male with high blood pressure who presents for initiation of weight loss medication.  He is interested in starting with Wegovy .  He requests a Toradol  shot for knee and back pain.  There is no family history of thyroid cancer, which is relevant for the safety of the medication.   Hypertension This is a chronic problem. The current episode started more than 1 year ago. The problem is unchanged. The problem is controlled. Pertinent negatives include no anxiety. There are no associated agents to hypertension. Risk factors for coronary artery disease include obesity, sedentary lifestyle and male gender. Past treatments include angiotensin blockers, calcium channel blockers and lifestyle changes. There are no compliance problems.      Past Medical History:  Diagnosis Date   Fatty liver 2017   Mild   History of gout    History of kidney stones    Hypertension    Morbid obesity (HCC)    OA (osteoarthritis) of knee    Pre-diabetes    Spinal stenosis 2018     Family History  Problem Relation Age of Onset   Hypertension Mother     Diabetes Mother    Cancer Father      Current Outpatient Medications:    cholecalciferol (VITAMIN D3) 25 MCG (1000 UT) tablet, Take 1,000 Units by mouth daily., Disp: , Rfl:    ibuprofen (ADVIL) 200 MG tablet, Take 600 mg by mouth every 6 (six) hours as needed (Arthritis)., Disp: , Rfl:    Olopatadine  HCl 0.2 % SOLN, One gtt in both eyes daily, Disp: 2.5 mL, Rfl: 0   amlodipine -olmesartan  (AZOR ) 10-20 MG tablet, Take 1 tablet by mouth daily., Disp: 90 tablet, Rfl: 1   Nebivolol  HCl (BYSTOLIC ) 20 MG TABS, Take 1 tablet (20 mg total) by mouth daily at 6 (six) AM., Disp: 90 tablet, Rfl: 1   oxyCODONE -acetaminophen  (PERCOCET/ROXICET) 5-325 MG tablet, Take 1-2 tablets by mouth every 6 (six) hours as needed for severe pain (pain score 7-10). (Patient not taking: Reported on 08/03/2024), Disp: 30 tablet, Rfl: 0   tirzepatide  (MOUNJARO ) 2.5 MG/0.5ML Pen, Inject 2.5 mg into the skin once a week. (Patient not taking: Reported on 08/03/2024), Disp: 2 mL, Rfl: 1   traMADol  (ULTRAM ) 50 MG tablet, Take 1 tablet (50 mg total) by mouth every 12 (twelve) hours as needed for moderate pain (pain score 4-6)., Disp: 30 tablet, Rfl: 0   No Known Allergies   Review of Systems  Constitutional: Negative.   Respiratory: Negative.    Cardiovascular: Negative.  Gastrointestinal: Negative.   Endocrine: Negative.   Musculoskeletal:  Positive for arthralgias.  Skin: Negative.   Allergic/Immunologic: Negative.   Hematological: Negative.      Today's Vitals   08/03/24 1409  BP: 132/84  Pulse: 92  Temp: 98.1 F (36.7 C)  SpO2: 98%  Weight: (!) 394 lb (178.7 kg)  Height: 5' 10 (1.778 m)   Body mass index is 56.53 kg/m.  Wt Readings from Last 3 Encounters:  08/03/24 (!) 394 lb (178.7 kg)  05/10/24 (!) 391 lb 3.2 oz (177.4 kg)  04/05/24 (!) 394 lb 6.4 oz (178.9 kg)    BP Readings from Last 3 Encounters:  08/03/24 132/84  05/10/24 132/74  04/05/24 134/84     Objective:  Physical Exam Vitals and  nursing note reviewed.  Constitutional:      Appearance: Normal appearance. He is obese.  HENT:     Head: Normocephalic and atraumatic.  Eyes:     Extraocular Movements: Extraocular movements intact.  Cardiovascular:     Rate and Rhythm: Normal rate and regular rhythm.     Heart sounds: Normal heart sounds.  Pulmonary:     Effort: Pulmonary effort is normal.     Breath sounds: Normal breath sounds.  Musculoskeletal:     Cervical back: Normal range of motion.  Skin:    General: Skin is warm.  Neurological:     General: No focal deficit present.     Mental Status: He is alert.  Psychiatric:        Mood and Affect: Mood normal.         Assessment And Plan:  Class 3 severe obesity due to excess calories with serious comorbidity and body mass index (BMI) of 50.0 to 59.9 in adult Assessment & Plan: BMI 56. Weight loss medication initiated despite lack of insurance coverage. No family history of thyroid cancer noted for medication safety.  Possible side effects discussed with the patient in detail.  - Administer first injection of 0.25 mg today. - Continue 0.25 mg injections weekly for 2 weeks, then increase to 0.5mg  weekly. - Schedule follow-up visit in October to assess progress and adjust dosage if necessary. - Will refer to [facility] for bariatric surgical evaluation - Encourage participation in exercise. - Participate in [place] surgery webinar for further treatment options.   Primary osteoarthritis of both knees Assessment & Plan: Toradol  injection given as part of management. Exercise and further treatment options considered. - Administer Toradol  injection, 60mg  IM x1  Orders: -     Ketorolac  Tromethamine  -     traMADol  HCl; Take 1 tablet (50 mg total) by mouth every 12 (twelve) hours as needed for moderate pain (pain score 4-6).  Dispense: 30 tablet; Refill: 0  Essential hypertension, benign Assessment & Plan: Chronic.  Goal BP<120/80.  Hypertension managed with  Bystolic  and amlodipine . Blood pressure checked today. - Refill Bystolic  20 mg daily. - Refill amlodipine  10 mg daily.c - Importance of following low sodium diet was discussed with the patient.  - F/u in 4 months    Return if symptoms worsen or fail to improve.  Patient was given opportunity to ask questions. Patient verbalized understanding of the plan and was able to repeat key elements of the plan. All questions were answered to their satisfaction.   I, Catheryn LOISE Slocumb, MD, have reviewed all documentation for this visit. The documentation on 08/03/24 for the exam, diagnosis, procedures, and orders are all accurate and complete.   IF YOU HAVE BEEN  REFERRED TO A SPECIALIST, IT MAY TAKE 1-2 WEEKS TO SCHEDULE/PROCESS THE REFERRAL. IF YOU HAVE NOT HEARD FROM US /SPECIALIST IN TWO WEEKS, PLEASE GIVE US  A CALL AT [phone].   THE PATIENT IS ENCOURAGED TO PRACTICE SOCIAL DISTANCING DUE TO THE COVID-19 PANDEMIC.

## 2024-08-03 NOTE — Assessment & Plan Note (Signed)
 Chronic.  Goal BP<120/80.  Hypertension managed with Bystolic  and amlodipine . Blood pressure checked today. - Refill Bystolic  20 mg daily. - Refill amlodipine  10 mg daily.c - Importance of following low sodium diet was discussed with the patient.  - F/u in 4 months

## 2024-08-03 NOTE — Assessment & Plan Note (Signed)
 BMI 56. Weight loss medication initiated despite lack of insurance coverage. No family history of thyroid cancer noted for medication safety.  Possible side effects discussed with the patient in detail.  - Administer first injection of 0.25 mg today. - Continue 0.25 mg injections weekly for 2 weeks, then increase to 0.5mg  weekly. - Schedule follow-up visit in October to assess progress and adjust dosage if necessary. - Will refer to [facility] for bariatric surgical evaluation - Encourage participation in exercise. - Participate in [place] surgery webinar for further treatment options.

## 2024-08-03 NOTE — Assessment & Plan Note (Signed)
 Toradol  injection given as part of management. Exercise and further treatment options considered. - Administer Toradol  injection, 60mg  IM x1

## 2024-08-03 NOTE — Patient Instructions (Signed)

## 2024-08-06 ENCOUNTER — Ambulatory Visit: Admitting: Orthopaedic Surgery

## 2024-08-06 DIAGNOSIS — M1712 Unilateral primary osteoarthritis, left knee: Secondary | ICD-10-CM | POA: Diagnosis not present

## 2024-08-06 DIAGNOSIS — M1711 Unilateral primary osteoarthritis, right knee: Secondary | ICD-10-CM

## 2024-08-06 DIAGNOSIS — Z6841 Body Mass Index (BMI) 40.0 and over, adult: Secondary | ICD-10-CM | POA: Diagnosis not present

## 2024-08-06 MED ORDER — LIDOCAINE HCL 1 % IJ SOLN
2.0000 mL | INTRAMUSCULAR | Status: AC | PRN
  Administered 2024-08-06: 2 mL

## 2024-08-06 MED ORDER — METHYLPREDNISOLONE ACETATE 40 MG/ML IJ SUSP
40.0000 mg | INTRAMUSCULAR | Status: AC | PRN
  Administered 2024-08-06: 40 mg via INTRA_ARTICULAR

## 2024-08-06 MED ORDER — BUPIVACAINE HCL 0.5 % IJ SOLN
2.0000 mL | INTRAMUSCULAR | Status: AC | PRN
  Administered 2024-08-06: 2 mL via INTRA_ARTICULAR

## 2024-08-06 NOTE — Progress Notes (Signed)
   Office Visit Note   Patient: [name] Gordon           Date of Birth: [birth year]           MRN: [id number] Visit Date: 08/06/2024              Requested by: Jarold Medici, MD [address] [place],  [place] [address] PCP: Jarold Medici, MD   Assessment & Plan: Visit Diagnoses:  1. Primary osteoarthritis of right knee   2. Primary osteoarthritis of left knee   3. Body mass index 50.0-59.9, adult (HCC)     Plan: History of Present Illness [name] Gordon is a 48 year old male with knee osteoarthritis who presents for cortisone injections in his knees.  He experiences significant knee pain and discomfort, which he manages with knee sleeves while working. He is currently using Ozempic  to aid in weight loss, a prerequisite for considering knee replacement surgery. He is also exploring weight loss surgery due to ineffective current weight loss efforts. He inquires about the potential benefits of gel injections for his knees.  Bilateral knee exams are unchanged from prior visit.  Assessment and Plan Bilateral knee osteoarthritis with deformity and instability Chronic osteoarthritis with significant deformity and instability, leading to knee dislocation. Cortisone injections are current management. Gel injections considered but may not be effective due to severity. Knee replacement is definitive but requires weight reduction. Ozempic  prescribed for weight loss. Knee sleeves used; reaction braces considered but fitting may be difficult. - Administer cortisone injections to knees. - Evaluate for reaction braces for knee support. - Discuss potential gel injections, considering cost and potential adverse reactions. - Continue weight loss efforts with Ozempic . - Consider weight loss surgery as a future option.  Follow-Up Instructions: No follow-ups on file.   Orders:  No orders of the defined types were placed in this encounter.  No orders of the defined types were placed in  this encounter.     Procedures: Large Joint Inj: bilateral knee on 08/06/2024 12:45 PM Indications: pain Details: 22 G needle  Arthrogram: No  Medications (Right): 2 mL lidocaine  1 %; 2 mL bupivacaine  0.5 %; 40 mg methylPREDNISolone  acetate 40 MG/ML Medications (Left): 2 mL lidocaine  1 %; 2 mL bupivacaine  0.5 %; 40 mg methylPREDNISolone  acetate 40 MG/ML Outcome: tolerated well, no immediate complications Patient was prepped and draped in the usual sterile fashion.

## 2024-09-20 ENCOUNTER — Ambulatory Visit: Admitting: Internal Medicine

## 2024-09-20 ENCOUNTER — Encounter: Payer: Self-pay | Admitting: Internal Medicine

## 2024-09-20 VITALS — BP 132/80 | HR 89 | Temp 98.4°F | Ht 70.0 in | Wt 383.2 lb

## 2024-09-20 DIAGNOSIS — R972 Elevated prostate specific antigen [PSA]: Secondary | ICD-10-CM | POA: Insufficient documentation

## 2024-09-20 DIAGNOSIS — E66813 Obesity, class 3: Secondary | ICD-10-CM | POA: Diagnosis not present

## 2024-09-20 DIAGNOSIS — R7303 Prediabetes: Secondary | ICD-10-CM | POA: Diagnosis not present

## 2024-09-20 DIAGNOSIS — Z6841 Body Mass Index (BMI) 40.0 and over, adult: Secondary | ICD-10-CM | POA: Diagnosis not present

## 2024-09-20 DIAGNOSIS — I10 Essential (primary) hypertension: Secondary | ICD-10-CM | POA: Diagnosis not present

## 2024-09-20 DIAGNOSIS — M170 Bilateral primary osteoarthritis of knee: Secondary | ICD-10-CM

## 2024-09-20 MED ORDER — OXYCODONE-ACETAMINOPHEN 5-325 MG PO TABS: 1.0000 | ORAL_TABLET | Freq: Four times a day (QID) | ORAL | 0 refills | Status: AC | PRN

## 2024-09-20 NOTE — Assessment & Plan Note (Signed)
 Previous labs reviewed, his A1c has been elevated in the past. I will check an A1c today. Reminded to avoid refined sugars including sugary drinks/foods and processed meats including bacon, sausages and deli meats.

## 2024-09-20 NOTE — Assessment & Plan Note (Signed)
 Discussion about the need to follow up with a urologist for elevated prostate levels. - Encourage follow-up with a urologist for elevated prostate levels.

## 2024-09-20 NOTE — Assessment & Plan Note (Signed)
 BMI 54.  Class 3 obesity with recent weight loss of 11 pounds over 8 weeks. Current BMI goal is to reach below 40. - Continue weekly semaglutide  injections. - Provide a sample of a higher dose pen for semaglutide , increase to 0.5mg  weekly - Set initial weight loss goal to achieve a BMI of 40. - Follow up in 8-10 weeks.

## 2024-09-20 NOTE — Patient Instructions (Signed)
 Hypertension, Adult Hypertension is another name for high blood pressure. High blood pressure forces your heart to work harder to pump blood. This can cause problems over time. There are two numbers in a blood pressure reading. There is a top number (systolic) over a bottom number (diastolic). It is best to have a blood pressure that is below 120/80. What are the causes? The cause of this condition is not known. Some other conditions can lead to high blood pressure. What increases the risk? Some lifestyle factors can make you more likely to develop high blood pressure: Smoking. Not getting enough exercise or physical activity. Being overweight. Having too much fat, sugar, calories, or salt (sodium) in your diet. Drinking too much alcohol. Other risk factors include: Having any of these conditions: Heart disease. Diabetes. High cholesterol. Kidney disease. Obstructive sleep apnea. Having a family history of high blood pressure and high cholesterol. Age. The risk increases with age. Stress. What are the signs or symptoms? High blood pressure may not cause symptoms. Very high blood pressure (hypertensive crisis) may cause: Headache. Fast or uneven heartbeats (palpitations). Shortness of breath. Nosebleed. Vomiting or feeling like you may vomit (nauseous). Changes in how you see. Very bad chest pain. Feeling dizzy. Seizures. How is this treated? This condition is treated by making healthy lifestyle changes, such as: Eating healthy foods. Exercising more. Drinking less alcohol. Your doctor may prescribe medicine if lifestyle changes do not help enough and if: Your top number is above 130. Your bottom number is above 80. Your personal target blood pressure may vary. Follow these instructions at home: Eating and drinking  If told, follow the DASH eating plan. To follow this plan: Fill one half of your plate at each meal with fruits and vegetables. Fill one fourth of your plate  at each meal with whole grains. Whole grains include whole-wheat pasta, brown rice, and whole-grain bread. Eat or drink low-fat dairy products, such as skim milk or low-fat yogurt. Fill one fourth of your plate at each meal with low-fat (lean) proteins. Low-fat proteins include fish, chicken without skin, eggs, beans, and tofu. Avoid fatty meat, cured and processed meat, or chicken with skin. Avoid pre-made or processed food. Limit the amount of salt in your diet to less than 1,500 mg each day. Do not drink alcohol if: Your doctor tells you not to drink. You are pregnant, may be pregnant, or are planning to become pregnant. If you drink alcohol: Limit how much you have to: 0-1 drink a day for women. 0-2 drinks a day for men. Know how much alcohol is in your drink. In the [place] one drink equals one 12 oz bottle of beer (355 mL), one 5 oz glass of wine (148 mL), or one 1 oz glass of hard liquor (44 mL). Lifestyle  Work with your doctor to stay at a healthy weight or to lose weight. Ask your doctor what the best weight is for you. Get at least 30 minutes of exercise that causes your heart to beat faster (aerobic exercise) most days of the week. This may include walking, swimming, or biking. Get at least 30 minutes of exercise that strengthens your muscles (resistance exercise) at least 3 days a week. This may include lifting weights or doing Pilates. Do not smoke or use any products that contain nicotine or tobacco. If you need help quitting, ask your doctor. Check your blood pressure at home as told by your doctor. Keep all follow-up visits. Medicines Take over-the-counter and prescription medicines  only as told by your doctor. Follow directions carefully. Do not skip doses of blood pressure medicine. The medicine does not work as well if you skip doses. Skipping doses also puts you at risk for problems. Ask your doctor about side effects or reactions to medicines that you should watch  for. Contact a doctor if: You think you are having a reaction to the medicine you are taking. You have headaches that keep coming back. You feel dizzy. You have swelling in your ankles. You have trouble with your vision. Get help right away if: You get a very bad headache. You start to feel mixed up (confused). You feel weak or numb. You feel faint. You have very bad pain in your: Chest. Belly (abdomen). You vomit more than once. You have trouble breathing. These symptoms may be an emergency. Get help right away. Call 911. Do not wait to see if the symptoms will go away. Do not drive yourself to the hospital. Summary Hypertension is another name for high blood pressure. High blood pressure forces your heart to work harder to pump blood. For most people, a normal blood pressure is less than 120/80. Making healthy choices can help lower blood pressure. If your blood pressure does not get lower with healthy choices, you may need to take medicine. This information is not intended to replace advice given to you by your health care provider. Make sure you discuss any questions you have with your health care provider. Document Revised: 09/13/2021 Document Reviewed: 09/13/2021 Elsevier Patient Education  2024 [employer].

## 2024-09-20 NOTE — Progress Notes (Signed)
 I,[name], CMA,acting as a Neurosurgeon for Catheryn LOISE Slocumb, MD.,have documented all relevant documentation on the behalf of Catheryn LOISE Slocumb, MD,as directed by  Catheryn LOISE Slocumb, MD while in the presence of Catheryn LOISE Slocumb, MD.  Subjective:  Patient ID: [name] Gordon , male    DOB: 10/09/1976 , 48 y.o.   MRN: [id number]  Chief Complaint  Patient presents with   Hypertension    Patient presents today for bp & predm follow up. She reports compliance with medications. Denies headache, chest pain & sob. He complains of knee pain in both knees. He asks for refill of pain medication.   Prediabetes    HPI Discussed the use of AI scribe software for clinical note transcription with the patient, who gave verbal consent to proceed.  History of Present Illness [name] Gordon is a 48 year old male with obesity and hypertension who presents for a weight check and medication review.  He has been on semaglutide  0.25 mg weekly for weight loss and has lost 11 pounds over the past eight weeks. He wants to increase the dose to 0.5 mg. He experiences occasional nausea but no vomiting. He has been using a pen obtained from a friend and is unsure of the exact start date of this treatment.  He is currently taking Bystolic  20 mg and amlodipine /olmesartan  10 mg for hypertension. His insurance did not cover Majara, so he is not taking it.  He experiences knee pain, for which he uses pain medication primarily on weekends after working 60 hours. His knees become inflamed and painful, especially after attending his son's soccer games. He is currently out of pain medication.  He has not yet seen a urologist for his elevated prostate levels. He expresses fear about this appointment, especially after the recent loss of his sister to cancer. His sister had breast cancer that recurred aggressively after many years.   Hypertension This is a chronic problem. The current episode started more than 1 year ago. The  problem is unchanged. The problem is controlled. Pertinent negatives include no anxiety. There are no associated agents to hypertension. Risk factors for coronary artery disease include obesity, sedentary lifestyle and male gender. Past treatments include angiotensin blockers, calcium channel blockers and lifestyle changes. There are no compliance problems.      Past Medical History:  Diagnosis Date   Fatty liver 2017   Mild   History of gout    History of kidney stones    Hypertension    Morbid obesity (HCC)    OA (osteoarthritis) of knee    Pre-diabetes    Spinal stenosis 2018     Family History  Problem Relation Age of Onset   Hypertension Mother    Diabetes Mother    Cancer Father      Current Outpatient Medications:    amlodipine -olmesartan  (AZOR ) 10-20 MG tablet, Take 1 tablet by mouth daily., Disp: 90 tablet, Rfl: 1   cholecalciferol (VITAMIN D3) 25 MCG (1000 UT) tablet, Take 1,000 Units by mouth daily., Disp: , Rfl:    ibuprofen (ADVIL) 200 MG tablet, Take 600 mg by mouth every 6 (six) hours as needed (Arthritis)., Disp: , Rfl:    Nebivolol  HCl (BYSTOLIC ) 20 MG TABS, Take 1 tablet (20 mg total) by mouth daily at 6 (six) AM., Disp: 90 tablet, Rfl: 1   Olopatadine  HCl 0.2 % SOLN, One gtt in both eyes daily, Disp: 2.5 mL, Rfl: 0   traMADol  (ULTRAM ) 50 MG tablet, Take 1 tablet (  50 mg total) by mouth every 12 (twelve) hours as needed for moderate pain (pain score 4-6)., Disp: 30 tablet, Rfl: 0   oxyCODONE -acetaminophen  (PERCOCET/ROXICET) 5-325 MG tablet, Take 1-2 tablets by mouth every 6 (six) hours as needed for severe pain (pain score 7-10)., Disp: 30 tablet, Rfl: 0   tirzepatide  (MOUNJARO ) 2.5 MG/0.5ML Pen, Inject 2.5 mg into the skin once a week. (Patient not taking: Reported on 09/20/2024), Disp: 2 mL, Rfl: 1   No Known Allergies   Review of Systems  Constitutional: Negative.   HENT: Negative.    Respiratory: Negative.    Cardiovascular: Negative.   Musculoskeletal:   Positive for arthralgias.       He c/o persistent knee pain.  Skin: Negative.   Allergic/Immunologic: Negative.   Hematological: Negative.      Today's Vitals   09/20/24 1518  BP: 132/80  Pulse: 89  Temp: 98.4 F (36.9 C)  SpO2: 98%  Weight: (!) 383 lb 3.2 oz (173.8 kg)  Height: 5' 10 (1.778 m)   Body mass index is 54.98 kg/m.  Wt Readings from Last 3 Encounters:  09/20/24 (!) 383 lb 3.2 oz (173.8 kg)  08/03/24 (!) 394 lb (178.7 kg)  05/10/24 (!) 391 lb 3.2 oz (177.4 kg)    BP Readings from Last 3 Encounters:  09/20/24 132/80  08/03/24 132/84  05/10/24 132/74     Objective:  Physical Exam Vitals and nursing note reviewed.  Constitutional:      Appearance: Normal appearance. He is obese.  HENT:     Head: Normocephalic and atraumatic.  Eyes:     Extraocular Movements: Extraocular movements intact.  Cardiovascular:     Rate and Rhythm: Normal rate and regular rhythm.     Heart sounds: Normal heart sounds.  Pulmonary:     Effort: Pulmonary effort is normal.     Breath sounds: Normal breath sounds.  Musculoskeletal:     Cervical back: Normal range of motion.  Skin:    General: Skin is warm.  Neurological:     General: No focal deficit present.     Mental Status: He is alert.  Psychiatric:        Mood and Affect: Mood normal.         Assessment And Plan:  Essential hypertension, benign Assessment & Plan: Chronic.  Goal BP<120/80.  Hypertension managed with Bystolic  and amlodipine . Blood pressure checked today. - Continue with Bystolic  20 mg daily and amlodipine /olmesartan  10/20mg  daily - Importance of following low sodium diet was discussed with the patient.  - F/u in 4 months  Orders: -     BMP8+EGFR  Prediabetes Assessment & Plan: Previous labs reviewed, his A1c has been elevated in the past. I will check an A1c today. Reminded to avoid refined sugars including sugary drinks/foods and processed meats including bacon, sausages and deli meats.     Orders: -     Hemoglobin A1c -     BMP8+EGFR  Primary osteoarthritis of both knees Assessment & Plan: Chronic, I will send refill of oxycodone  to use prn for severe pain. PDMP reviewed.   - He will strive to achieve BMI<40 so he may move forward with b/l knee replacement.     Elevated PSA Assessment & Plan: Discussion about the need to follow up with a urologist for elevated prostate levels. - Encourage follow-up with a urologist for elevated prostate levels.   Class 3 severe obesity due to excess calories with serious comorbidity and body mass index (BMI) of  50.0 to 59.9 in adult [facility]) Assessment & Plan: BMI 54.  Class 3 obesity with recent weight loss of 11 pounds over 8 weeks. Current BMI goal is to reach below 40. - Continue weekly semaglutide  injections. - Provide a sample of a higher dose pen for semaglutide , increase to 0.5mg  weekly - Set initial weight loss goal to achieve a BMI of 40. - Follow up in 8-10 weeks.    Other orders -     oxyCODONE -Acetaminophen ; Take 1-2 tablets by mouth every 6 (six) hours as needed for severe pain (pain score 7-10).  Dispense: 30 tablet; Refill: 0   Return in 8 weeks (on 11/15/2024), or weight check-Mon am.  Patient was given opportunity to ask questions. Patient verbalized understanding of the plan and was able to repeat key elements of the plan. All questions were answered to their satisfaction.   I, Catheryn LOISE Slocumb, MD, have reviewed all documentation for this visit. The documentation on 09/20/24 for the exam, diagnosis, procedures, and orders are all accurate and complete.   IF YOU HAVE BEEN REFERRED TO A SPECIALIST, IT MAY TAKE 1-2 WEEKS TO SCHEDULE/PROCESS THE REFERRAL. IF YOU HAVE NOT HEARD FROM US /SPECIALIST IN TWO WEEKS, PLEASE GIVE US  A CALL AT [phone] X [phone].   THE PATIENT IS ENCOURAGED TO PRACTICE SOCIAL DISTANCING DUE TO THE COVID-19 PANDEMIC.

## 2024-09-20 NOTE — Assessment & Plan Note (Signed)
 Chronic, I will send refill of oxycodone  to use prn for severe pain. PDMP reviewed.   - He will strive to achieve BMI<40 so he may move forward with b/l knee replacement.

## 2024-09-20 NOTE — Assessment & Plan Note (Signed)
 Chronic.  Goal BP<120/80.  Hypertension managed with Bystolic  and amlodipine . Blood pressure checked today. - Continue with Bystolic  20 mg daily and amlodipine /olmesartan  10/20mg  daily - Importance of following low sodium diet was discussed with the patient.  - F/u in 4 months

## 2024-09-21 LAB — BMP8+EGFR
BUN/Creatinine Ratio: 12 (ref 9–20)
BUN: 10 mg/dL (ref 6–24)
CO2: 22 mmol/L (ref 20–29)
Calcium: 9.2 mg/dL (ref 8.7–10.2)
Chloride: 106 mmol/L (ref 96–106)
Creatinine, Ser: 0.85 mg/dL (ref 0.76–1.27)
Glucose: 75 mg/dL (ref 70–99)
Potassium: 3.9 mmol/L (ref 3.5–5.2)
Sodium: 145 mmol/L — ABNORMAL HIGH (ref 134–144)
eGFR: 107 mL/min/1.73 (ref >59)

## 2024-09-21 LAB — HEMOGLOBIN A1C
Est. average glucose Bld gHb Est-mCnc: 114 mg/dL
Hgb A1c MFr Bld: 5.6 % (ref 4.8–5.6)

## 2024-09-27 ENCOUNTER — Ambulatory Visit: Payer: Self-pay | Admitting: Internal Medicine

## 2024-10-11 ENCOUNTER — Encounter: Payer: Self-pay | Admitting: Radiology

## 2024-11-16 ENCOUNTER — Ambulatory Visit: Payer: Self-pay | Admitting: Internal Medicine

## 2024-11-16 ENCOUNTER — Encounter: Payer: Self-pay | Admitting: Internal Medicine

## 2024-11-16 DIAGNOSIS — I10 Essential (primary) hypertension: Secondary | ICD-10-CM | POA: Diagnosis not present

## 2024-11-16 DIAGNOSIS — M170 Bilateral primary osteoarthritis of knee: Secondary | ICD-10-CM | POA: Diagnosis not present

## 2024-11-16 MED ORDER — TRAMADOL HCL 50 MG PO TABS: 50.0000 mg | ORAL_TABLET | Freq: Two times a day (BID) | ORAL | 0 refills | Status: AC | PRN

## 2024-11-16 MED ORDER — SEMAGLUTIDE (1 MG/DOSE) 4 MG/3ML ~~LOC~~ SOPN: 1.0000 mg | PEN_INJECTOR | SUBCUTANEOUS | Status: AC

## 2024-11-16 NOTE — Progress Notes (Signed)
 I,[name] Gordon, CMA,acting as a neurosurgeon for [name] LOISE Slocumb, MD.,have documented all relevant documentation on the behalf of [name] LOISE Slocumb, MD,as directed by  [name] LOISE Slocumb, MD while in the presence of [name] LOISE Slocumb, MD.  Subjective:  Patient ID: [name] Gordon , male    DOB: 1976-02-07 , 48 y.o.   MRN: [id number]  Chief Complaint  Patient presents with   Weight Check    Patient presents today for weight, bp & predm follow up. He reports compliance with medications. Denies headache, chest pain & sob. He would like a refill on Tramadol .    Hypertension    HPI Discussed the use of AI scribe software for clinical note transcription with the patient, who gave verbal consent to proceed.  History of Present Illness [name] Gordon is a 48 year old male who presents for a weight check and blood pressure monitoring.  He is actively working on weight loss in preparation for knee surgery, aiming to lose 120 pounds from an initial weight of approximately 395 pounds. He has successfully lost 24 pounds, with 13 pounds lost since October. He is currently using semaglutide  (Ozempic ) at a dose of 0.5 mg. Occasional nausea occurs when he overeats, but he otherwise tolerates the medication well. No issues with bowel movements are reported.  He is managing knee pain and is requesting a refill of tramadol . He has not yet visited a urologist, expressing fear and anxiety about the visit. He feels his erections are less strong than before and sometimes wonders if it is due to stress. Stress in his personal life, particularly related to insurance coverage and personal issues, may be impacting his overall well-being.  He has been using his wife's insurance for potential surgery coverage, but due to ongoing concerns about the cost, he switched back to his own insurance in October. He plans to reconsider his insurance options next year. Concerns about insurance coverage for medications like Mounjaro  and  Ozempic  in the new year are also present.    Hypertension This is a chronic problem. The current episode started more than 1 year ago. The problem is unchanged. The problem is controlled. Pertinent negatives include no anxiety. There are no associated agents to hypertension. Risk factors for coronary artery disease include obesity, sedentary lifestyle and male gender. Past treatments include angiotensin blockers, calcium channel blockers and lifestyle changes. There are no compliance problems.      Past Medical History:  Diagnosis Date   Fatty liver 2017   Mild   History of gout    History of kidney stones    Hypertension    Morbid obesity (HCC)    OA (osteoarthritis) of knee    Pre-diabetes    Spinal stenosis 2018     Family History  Problem Relation Age of Onset   Hypertension Mother    Diabetes Mother    Cancer Father      Current Outpatient Medications:    amlodipine -olmesartan  (AZOR ) 10-20 MG tablet, Take 1 tablet by mouth daily., Disp: 90 tablet, Rfl: 1   cholecalciferol (VITAMIN D3) 25 MCG (1000 UT) tablet, Take 1,000 Units by mouth daily., Disp: , Rfl:    ibuprofen (ADVIL) 200 MG tablet, Take 600 mg by mouth every 6 (six) hours as needed (Arthritis)., Disp: , Rfl:    Nebivolol  HCl (BYSTOLIC ) 20 MG TABS, Take 1 tablet (20 mg total) by mouth daily at 6 (six) AM., Disp: 90 tablet, Rfl: 1   Olopatadine  HCl 0.2 % SOLN, One gtt  in both eyes daily, Disp: 2.5 mL, Rfl: 0   oxyCODONE -acetaminophen  (PERCOCET/ROXICET) 5-325 MG tablet, Take 1-2 tablets by mouth every 6 (six) hours as needed for severe pain (pain score 7-10)., Disp: 30 tablet, Rfl: 0   Semaglutide , 1 MG/DOSE, 4 MG/3ML SOPN, Inject 1 mg into the skin once a week. Inject 0.75mg  once weekly, Disp: , Rfl:    traMADol  (ULTRAM ) 50 MG tablet, Take 1 tablet (50 mg total) by mouth every 12 (twelve) hours as needed for moderate pain (pain score 4-6)., Disp: 30 tablet, Rfl: 0   No Known Allergies   Review of Systems   Constitutional: Negative.   Respiratory: Negative.    Cardiovascular: Negative.   Gastrointestinal: Negative.   Endocrine: Negative.   Musculoskeletal:  Positive for arthralgias.  Skin: Negative.   Allergic/Immunologic: Negative.   Psychiatric/Behavioral: Negative.       Today's Vitals   11/16/24 1404  BP: 130/82  Pulse: 98  Temp: 98.3 F (36.8 C)  SpO2: 98%  Weight: (!) 370 lb 9.6 oz (168.1 kg)  Height: 5' 10 (1.778 m)   Body mass index is 53.18 kg/m.  Wt Readings from Last 3 Encounters:  11/16/24 (!) 370 lb 9.6 oz (168.1 kg)  09/20/24 (!) 383 lb 3.2 oz (173.8 kg)  08/03/24 (!) 394 lb (178.7 kg)    The 10-year ASCVD risk score (Arnett DK, et al., 2019) is: 8.5%   Values used to calculate the score:     Age: 64 years     Clincally relevant sex: Male     Is Non-Hispanic African American: Yes     Diabetic: No     Tobacco smoker: No     Systolic Blood Pressure: 130 mmHg     Is BP treated: Yes     HDL Cholesterol: 53 mg/dL     Total Cholesterol: 171 mg/dL  Objective:  Physical Exam Vitals and nursing note reviewed.  Constitutional:      Appearance: Normal appearance. He is obese.  HENT:     Head: Normocephalic and atraumatic.  Eyes:     Extraocular Movements: Extraocular movements intact.  Cardiovascular:     Rate and Rhythm: Normal rate and regular rhythm.     Heart sounds: Normal heart sounds.  Pulmonary:     Effort: Pulmonary effort is normal.     Breath sounds: Normal breath sounds.  Musculoskeletal:     Cervical back: Normal range of motion.  Skin:    General: Skin is warm.  Neurological:     General: No focal deficit present.     Mental Status: He is alert.  Psychiatric:        Mood and Affect: Mood normal.         Assessment And Plan:   Assessment & Plan Morbid obesity (HCC) BMI 53. Lost 24 pounds since October. Goal: lose 120 pounds before knee surgery. Currently on semaglutide  0.5 mg, plan to increase to 0.75 mg. Insurance for Mounjaro   uncertain, reassess after new year. - Increased semaglutide  to 0.75 mg using 2 mg pen. - Reassess insurance for Wegovy  after new year. Essential hypertension, benign Chronic.  Goal BP<120/80.  Hypertension managed with Bystolic  and amlodipine . Blood pressure checked today. - Continue with Bystolic  20 mg daily and amlodipine /olmesartan  10/20mg  daily - Importance of following low sodium diet was discussed with the patient.  - F/u in 4 months Primary osteoarthritis of both knees Chronic knee pain due to osteoarthritis. Considering knee surgery, focusing on weight loss first. Insurance coverage  for surgery is a concern. - PDMP reviewed.  - Refilled tramadol  for pain management. - Reassess insurance for knee surgery after new year.  No orders of the defined types were placed in this encounter.  Return in 9 weeks (on 01/18/2025), or weight check.  Patient was given opportunity to ask questions. Patient verbalized understanding of the plan and was able to repeat key elements of the plan. All questions were answered to their satisfaction.   I, [name] LOISE Slocumb, MD, have reviewed all documentation for this visit. The documentation on 11/16/24 for the exam, diagnosis, procedures, and orders are all accurate and complete.   IF YOU HAVE BEEN REFERRED TO A SPECIALIST, IT MAY TAKE 1-2 WEEKS TO SCHEDULE/PROCESS THE REFERRAL. IF YOU HAVE NOT HEARD FROM US /SPECIALIST IN TWO WEEKS, PLEASE GIVE US  A CALL AT [phone].

## 2024-11-16 NOTE — Assessment & Plan Note (Signed)
 Chronic.  Goal BP<120/80.  Hypertension managed with Bystolic  and amlodipine . Blood pressure checked today. - Continue with Bystolic  20 mg daily and amlodipine /olmesartan  10/20mg  daily - Importance of following low sodium diet was discussed with the patient.  - F/u in 4 months

## 2024-11-16 NOTE — Assessment & Plan Note (Signed)
 Chronic knee pain due to osteoarthritis. Considering knee surgery, focusing on weight loss first. Insurance coverage for surgery is a concern. - PDMP reviewed.  - Refilled tramadol  for pain management. - Reassess insurance for knee surgery after new year.

## 2024-11-16 NOTE — Assessment & Plan Note (Signed)
 BMI 53. Lost 24 pounds since October. Goal: lose 120 pounds before knee surgery. Currently on semaglutide  0.5 mg, plan to increase to 0.75 mg. Insurance for Mounjaro  uncertain, reassess after new year. - Increased semaglutide  to 0.75 mg using 2 mg pen. - Reassess insurance for Wegovy  after new year.

## 2024-11-16 NOTE — Patient Instructions (Signed)
 Hypertension, Adult Hypertension is another name for high blood pressure. High blood pressure forces your heart to work harder to pump blood. This can cause problems over time. There are two numbers in a blood pressure reading. There is a top number (systolic) over a bottom number (diastolic). It is best to have a blood pressure that is below 120/80. What are the causes? The cause of this condition is not known. Some other conditions can lead to high blood pressure. What increases the risk? Some lifestyle factors can make you more likely to develop high blood pressure: Smoking. Not getting enough exercise or physical activity. Being overweight. Having too much fat, sugar, calories, or salt (sodium) in your diet. Drinking too much alcohol. Other risk factors include: Having any of these conditions: Heart disease. Diabetes. High cholesterol. Kidney disease. Obstructive sleep apnea. Having a family history of high blood pressure and high cholesterol. Age. The risk increases with age. Stress. What are the signs or symptoms? High blood pressure may not cause symptoms. Very high blood pressure (hypertensive crisis) may cause: Headache. Fast or uneven heartbeats (palpitations). Shortness of breath. Nosebleed. Vomiting or feeling like you may vomit (nauseous). Changes in how you see. Very bad chest pain. Feeling dizzy. Seizures. How is this treated? This condition is treated by making healthy lifestyle changes, such as: Eating healthy foods. Exercising more. Drinking less alcohol. Your doctor may prescribe medicine if lifestyle changes do not help enough and if: Your top number is above 130. Your bottom number is above 80. Your personal target blood pressure may vary. Follow these instructions at home: Eating and drinking  If told, follow the DASH eating plan. To follow this plan: Fill one half of your plate at each meal with fruits and vegetables. Fill one fourth of your plate  at each meal with whole grains. Whole grains include whole-wheat pasta, brown rice, and whole-grain bread. Eat or drink low-fat dairy products, such as skim milk or low-fat yogurt. Fill one fourth of your plate at each meal with low-fat (lean) proteins. Low-fat proteins include fish, chicken without skin, eggs, beans, and tofu. Avoid fatty meat, cured and processed meat, or chicken with skin. Avoid pre-made or processed food. Limit the amount of salt in your diet to less than 1,500 mg each day. Do not drink alcohol if: Your doctor tells you not to drink. You are pregnant, may be pregnant, or are planning to become pregnant. If you drink alcohol: Limit how much you have to: 0-1 drink a day for women. 0-2 drinks a day for men. Know how much alcohol is in your drink. In the [place] one drink equals one 12 oz bottle of beer (355 mL), one 5 oz glass of wine (148 mL), or one 1 oz glass of hard liquor (44 mL). Lifestyle  Work with your doctor to stay at a healthy weight or to lose weight. Ask your doctor what the best weight is for you. Get at least 30 minutes of exercise that causes your heart to beat faster (aerobic exercise) most days of the week. This may include walking, swimming, or biking. Get at least 30 minutes of exercise that strengthens your muscles (resistance exercise) at least 3 days a week. This may include lifting weights or doing Pilates. Do not smoke or use any products that contain nicotine or tobacco. If you need help quitting, ask your doctor. Check your blood pressure at home as told by your doctor. Keep all follow-up visits. Medicines Take over-the-counter and prescription medicines  only as told by your doctor. Follow directions carefully. Do not skip doses of blood pressure medicine. The medicine does not work as well if you skip doses. Skipping doses also puts you at risk for problems. Ask your doctor about side effects or reactions to medicines that you should watch  for. Contact a doctor if: You think you are having a reaction to the medicine you are taking. You have headaches that keep coming back. You feel dizzy. You have swelling in your ankles. You have trouble with your vision. Get help right away if: You get a very bad headache. You start to feel mixed up (confused). You feel weak or numb. You feel faint. You have very bad pain in your: Chest. Belly (abdomen). You vomit more than once. You have trouble breathing. These symptoms may be an emergency. Get help right away. Call 911. Do not wait to see if the symptoms will go away. Do not drive yourself to the hospital. Summary Hypertension is another name for high blood pressure. High blood pressure forces your heart to work harder to pump blood. For most people, a normal blood pressure is less than 120/80. Making healthy choices can help lower blood pressure. If your blood pressure does not get lower with healthy choices, you may need to take medicine. This information is not intended to replace advice given to you by your health care provider. Make sure you discuss any questions you have with your health care provider. Document Revised: 09/13/2021 Document Reviewed: 09/13/2021 Elsevier Patient Education  2024 [employer].

## 2025-01-31 ENCOUNTER — Ambulatory Visit: Admitting: Internal Medicine

## 2025-05-12 ENCOUNTER — Encounter: Admitting: Internal Medicine

## 2025-05-16 ENCOUNTER — Encounter: Payer: Self-pay | Admitting: Internal Medicine
